# Patient Record
Sex: Female | Born: 1975 | Race: Black or African American | Hispanic: No | Marital: Single | Smoking: Never smoker
Health system: Southern US, Community
[De-identification: ages and names within clinical notes are randomized; demographics above are authoritative.]

## PROBLEM LIST (undated history)

## (undated) DIAGNOSIS — J302 Other seasonal allergic rhinitis: Secondary | ICD-10-CM

## (undated) DIAGNOSIS — R51 Headache: Secondary | ICD-10-CM

## (undated) DIAGNOSIS — J45909 Unspecified asthma, uncomplicated: Secondary | ICD-10-CM

## (undated) HISTORY — DX: Unspecified asthma, uncomplicated: J45.909

## (undated) HISTORY — DX: Other seasonal allergic rhinitis: J30.2

---

## 2001-03-27 ENCOUNTER — Encounter: Payer: Self-pay | Admitting: Family Medicine

## 2001-03-27 ENCOUNTER — Ambulatory Visit (HOSPITAL_COMMUNITY): Admission: RE | Admit: 2001-03-27 | Discharge: 2001-03-27 | Payer: Self-pay | Admitting: Family Medicine

## 2004-11-13 ENCOUNTER — Ambulatory Visit (HOSPITAL_COMMUNITY): Admission: RE | Admit: 2004-11-13 | Discharge: 2004-11-13 | Payer: Self-pay | Admitting: Internal Medicine

## 2005-02-02 ENCOUNTER — Emergency Department (HOSPITAL_COMMUNITY): Admission: EM | Admit: 2005-02-02 | Discharge: 2005-02-02 | Payer: Self-pay | Admitting: Emergency Medicine

## 2005-06-05 ENCOUNTER — Ambulatory Visit (HOSPITAL_COMMUNITY): Admission: RE | Admit: 2005-06-05 | Discharge: 2005-06-05 | Payer: Self-pay | Admitting: Obstetrics & Gynecology

## 2005-06-20 ENCOUNTER — Ambulatory Visit (HOSPITAL_COMMUNITY): Admission: RE | Admit: 2005-06-20 | Discharge: 2005-06-20 | Payer: Self-pay | Admitting: Obstetrics & Gynecology

## 2005-06-25 ENCOUNTER — Inpatient Hospital Stay (HOSPITAL_COMMUNITY): Admission: AD | Admit: 2005-06-25 | Discharge: 2005-06-25 | Payer: Self-pay | Admitting: Obstetrics & Gynecology

## 2005-07-22 ENCOUNTER — Ambulatory Visit (HOSPITAL_COMMUNITY): Admission: RE | Admit: 2005-07-22 | Discharge: 2005-07-22 | Payer: Self-pay | Admitting: Obstetrics & Gynecology

## 2005-11-30 ENCOUNTER — Emergency Department (HOSPITAL_COMMUNITY): Admission: EM | Admit: 2005-11-30 | Discharge: 2005-12-01 | Payer: Self-pay | Admitting: Emergency Medicine

## 2006-01-09 ENCOUNTER — Encounter: Admission: RE | Admit: 2006-01-09 | Discharge: 2006-01-09 | Payer: Self-pay | Admitting: Ophthalmology

## 2006-06-02 ENCOUNTER — Emergency Department (HOSPITAL_COMMUNITY): Admission: EM | Admit: 2006-06-02 | Discharge: 2006-06-03 | Payer: Self-pay | Admitting: Emergency Medicine

## 2007-03-20 ENCOUNTER — Encounter: Admission: RE | Admit: 2007-03-20 | Discharge: 2007-03-20 | Payer: Self-pay | Admitting: Internal Medicine

## 2007-03-27 ENCOUNTER — Encounter (INDEPENDENT_AMBULATORY_CARE_PROVIDER_SITE_OTHER): Payer: Self-pay | Admitting: *Deleted

## 2007-03-27 ENCOUNTER — Ambulatory Visit (HOSPITAL_COMMUNITY): Admission: RE | Admit: 2007-03-27 | Discharge: 2007-03-27 | Payer: Self-pay | Admitting: *Deleted

## 2007-12-22 ENCOUNTER — Emergency Department (HOSPITAL_COMMUNITY): Admission: EM | Admit: 2007-12-22 | Discharge: 2007-12-23 | Payer: Self-pay | Admitting: Emergency Medicine

## 2008-10-04 ENCOUNTER — Encounter: Admission: RE | Admit: 2008-10-04 | Discharge: 2008-10-04 | Payer: Self-pay | Admitting: Internal Medicine

## 2009-07-01 HISTORY — PX: OTHER SURGICAL HISTORY: SHX169

## 2009-09-05 ENCOUNTER — Ambulatory Visit (HOSPITAL_COMMUNITY): Admission: RE | Admit: 2009-09-05 | Discharge: 2009-09-05 | Payer: Self-pay | Admitting: Obstetrics and Gynecology

## 2009-12-07 ENCOUNTER — Encounter: Admission: RE | Admit: 2009-12-07 | Discharge: 2009-12-07 | Payer: Self-pay | Admitting: Specialist

## 2010-07-22 ENCOUNTER — Encounter: Payer: Self-pay | Admitting: Internal Medicine

## 2010-07-22 ENCOUNTER — Encounter: Payer: Self-pay | Admitting: Obstetrics & Gynecology

## 2010-09-23 LAB — HCG, SERUM, QUALITATIVE: Preg, Serum: NEGATIVE

## 2010-09-23 LAB — CBC
MCHC: 34.4 g/dL (ref 30.0–36.0)
MCV: 97.5 fL (ref 78.0–100.0)
Platelets: 142 10*3/uL — ABNORMAL LOW (ref 150–400)
RBC: 4.02 MIL/uL (ref 3.87–5.11)

## 2010-11-13 NOTE — Op Note (Signed)
Amber Neal, POMMIER                     ACCOUNT NO.:  [id number]   MEDICAL RECORD NO.:  [id number]          PATIENT TYPE:  AMB   LOCATION:  SDC                           FACILITY:  [facility]   PHYSICIAN:  Gahanna B. Earlene Plater, M.D.  DATE OF BIRTH:  03/06/1976   DATE OF PROCEDURE:  03/27/2007  DATE OF DISCHARGE:                               OPERATIVE REPORT   PREOPERATIVE DIAGNOSES:  Abnormal uterine bleeding.   POSTOPERATIVE DIAGNOSES:  Abnormal uterine bleeding.   PROCEDURE:  Hysteroscopy D&C.   SURGEON:  Dr. Marina Gravel.   ASSISTANT:  None.   ANESTHESIA:  LMA general and 20 mL of 1% lidocaine paracervical block.   FINDINGS:  Proliferative endometrium, no definitive polyps.   SPECIMENS:  Endometrial curettings to pathology.   BLOOD LOSS:  Minimal.   COMPLICATIONS:  None.   FLUID DEFICIT:  Minimal.   INDICATIONS:  Patient with a history of irregular bleeding.  Ultrasound  showed a thickened endometrial stripe. Sonohysterogram showed a discrete  mass consistent with a polyp.  The patient advised of the risks of  surgery including infection, bleeding, uterine perforation, and damage  to surrounding organs.   PROCEDURE:  The patient was taken to the operating room and LMA  anesthesia obtained.  She was prepped and draped in standard fashion.  The bladder was emptied with in-and-out cath. Exam under anesthesia  showed a slightly enlarged anteverted uterus, no adnexal masses.   A paracervical block placed.  Cervix dilated to #21.  The diagnostic  hysteroscope was inserted after being flushed.  Good uterine distention  obtained.  Overall proliferative appearing endometrium without  definitive polyp seen.  The endometrium was sampled with the Randall  stone forceps and some polyp-like tissue was returned.  The endometrium  was then gently curetted. The scope was returned, no additional  abnormalities were seen and therefore the procedure was terminated.   Instruments were removed,  cervix hemostatic.  The patient tolerated the  procedure well with no complications.  She was taken to the recovery  room awake, alert in stable condition.      Gerri Spore B. Earlene Plater, M.D.  Electronically Signed     WBD/MEDQ  D:  03/27/2007  T:  03/27/2007  Job:  [id number]

## 2010-12-07 ENCOUNTER — Other Ambulatory Visit (HOSPITAL_COMMUNITY): Payer: Self-pay

## 2011-01-30 ENCOUNTER — Encounter (HOSPITAL_COMMUNITY): Payer: Self-pay | Admitting: *Deleted

## 2011-02-01 ENCOUNTER — Encounter (HOSPITAL_COMMUNITY): Payer: Self-pay | Admitting: Anesthesiology

## 2011-02-01 ENCOUNTER — Ambulatory Visit (HOSPITAL_COMMUNITY): Payer: BC Managed Care – PPO | Admitting: Anesthesiology

## 2011-02-01 ENCOUNTER — Other Ambulatory Visit: Payer: Self-pay | Admitting: Obstetrics and Gynecology

## 2011-02-01 ENCOUNTER — Encounter (HOSPITAL_COMMUNITY)
Admission: RE | Disposition: A | Discharge status: Home / Self Care | Payer: Self-pay | Source: Ambulatory Visit | Attending: Obstetrics and Gynecology

## 2011-02-01 ENCOUNTER — Ambulatory Visit (HOSPITAL_COMMUNITY): Payer: BC Managed Care – PPO

## 2011-02-01 ENCOUNTER — Ambulatory Visit (HOSPITAL_COMMUNITY)
Admission: RE | Admit: 2011-02-01 | Discharge: 2011-02-02 | Disposition: A | Discharge status: Home / Self Care | Payer: BC Managed Care – PPO | Source: Ambulatory Visit | Attending: Obstetrics and Gynecology | Admitting: Obstetrics and Gynecology

## 2011-02-01 DIAGNOSIS — N7013 Chronic salpingitis and oophoritis: Secondary | ICD-10-CM | POA: Insufficient documentation

## 2011-02-01 DIAGNOSIS — N971 Female infertility of tubal origin: Secondary | ICD-10-CM | POA: Insufficient documentation

## 2011-02-01 HISTORY — DX: Headache: R51

## 2011-02-01 HISTORY — PX: LAPAROSCOPY: SHX197

## 2011-02-01 LAB — CBC
HCT: 38.3 % (ref 36.0–46.0)
Hemoglobin: 13.9 g/dL (ref 12.0–15.0)
MCHC: 36.3 g/dL — ABNORMAL HIGH (ref 30.0–36.0)
Platelets: 165 10*3/uL (ref 150–400)
RBC: 4.12 MIL/uL (ref 3.87–5.11)
WBC: 4.6 10*3/uL (ref 4.0–10.5)

## 2011-02-01 SURGERY — LAPAROSCOPY OPERATIVE
Anesthesia: General | Site: Abdomen | Wound class: Clean Contaminated

## 2011-02-01 MED ORDER — SCOPOLAMINE 1 MG/3DAYS TD PT72: 1.0000 | MEDICATED_PATCH | Freq: Once | TRANSDERMAL | Status: DC

## 2011-02-01 MED ORDER — ROCURONIUM BROMIDE 100 MG/10ML IV SOLN
INTRAVENOUS | Status: DC | PRN
  Administered 2011-02-01: 25 mg via INTRAVENOUS

## 2011-02-01 MED ORDER — MIDAZOLAM HCL 2 MG/2ML IJ SOLN
INTRAMUSCULAR | Status: AC
  Filled 2011-02-01: qty 2

## 2011-02-01 MED ORDER — NEOSTIGMINE METHYLSULFATE 1 MG/ML IJ SOLN
INTRAMUSCULAR | Status: AC
  Filled 2011-02-01: qty 10

## 2011-02-01 MED ORDER — HYDROCODONE-ACETAMINOPHEN 5-325 MG PO TABS
1.0000 | ORAL_TABLET | Freq: Four times a day (QID) | ORAL | Status: DC | PRN
  Administered 2011-02-01 – 2011-02-02 (×2): 1 via ORAL
  Filled 2011-02-01: qty 1

## 2011-02-01 MED ORDER — MUPIROCIN 2 % EX OINT
TOPICAL_OINTMENT | CUTANEOUS | Status: AC
  Filled 2011-02-01: qty 22

## 2011-02-01 MED ORDER — CITRIC ACID-SODIUM CITRATE 334-500 MG/5ML PO SOLN: 30.0000 mL | Freq: Once | ORAL | Status: DC | PRN

## 2011-02-01 MED ORDER — BUPIVACAINE-EPINEPHRINE 0.25% -1:200000 IJ SOLN
INTRAMUSCULAR | Status: DC | PRN
  Administered 2011-02-01: 8 mL

## 2011-02-01 MED ORDER — FENTANYL CITRATE 0.05 MG/ML IJ SOLN
INTRAMUSCULAR | Status: DC | PRN
  Administered 2011-02-01 (×5): 50 ug via INTRAVENOUS

## 2011-02-01 MED ORDER — METHYLENE BLUE 1 % INJ SOLN
INTRAMUSCULAR | Status: DC | PRN
  Administered 2011-02-01: 1 mL via SUBMUCOSAL

## 2011-02-01 MED ORDER — FENTANYL CITRATE 0.05 MG/ML IJ SOLN
INTRAMUSCULAR | Status: AC
  Filled 2011-02-01: qty 2

## 2011-02-01 MED ORDER — FENTANYL CITRATE 0.05 MG/ML IJ SOLN
25.0000 ug | INTRAMUSCULAR | Status: DC | PRN
  Administered 2011-02-01: 50 ug via INTRAVENOUS
  Administered 2011-02-01: 25 ug via INTRAVENOUS

## 2011-02-01 MED ORDER — IOHEXOL 300 MG/ML  SOLN
INTRAMUSCULAR | Status: DC | PRN
  Administered 2011-02-01: 50 mL via INTRATHECAL

## 2011-02-01 MED ORDER — METOCLOPRAMIDE HCL 10 MG PO TABS: 10.0000 mg | ORAL_TABLET | Freq: Once | ORAL | Status: DC

## 2011-02-01 MED ORDER — LIDOCAINE HCL (CARDIAC) 20 MG/ML IV SOLN
INTRAVENOUS | Status: AC
  Filled 2011-02-01: qty 5

## 2011-02-01 MED ORDER — DOXYCYCLINE HYCLATE 100 MG IV SOLR
100.0000 mg | Freq: Once | INTRAVENOUS | Status: AC
  Administered 2011-02-01: 100 mg via INTRAVENOUS
  Filled 2011-02-01: qty 100

## 2011-02-01 MED ORDER — PROPOFOL 10 MG/ML IV EMUL
INTRAVENOUS | Status: DC | PRN
  Administered 2011-02-01: 150 mg via INTRAVENOUS

## 2011-02-01 MED ORDER — ROCURONIUM BROMIDE 50 MG/5ML IV SOLN
INTRAVENOUS | Status: AC
  Filled 2011-02-01: qty 1

## 2011-02-01 MED ORDER — DEXAMETHASONE SODIUM PHOSPHATE 10 MG/ML IJ SOLN
INTRAMUSCULAR | Status: AC
  Filled 2011-02-01: qty 1

## 2011-02-01 MED ORDER — ONDANSETRON HCL 4 MG/2ML IJ SOLN
INTRAMUSCULAR | Status: AC
  Filled 2011-02-01: qty 2

## 2011-02-01 MED ORDER — LACTATED RINGERS IR SOLN
Status: DC | PRN
  Administered 2011-02-01: 3000 mL

## 2011-02-01 MED ORDER — DEXAMETHASONE SODIUM PHOSPHATE 4 MG/ML IJ SOLN
INTRAMUSCULAR | Status: DC | PRN
  Administered 2011-02-01: 10 mg via INTRAVENOUS

## 2011-02-01 MED ORDER — KETOROLAC TROMETHAMINE 30 MG/ML IJ SOLN
INTRAMUSCULAR | Status: AC
  Filled 2011-02-01: qty 1

## 2011-02-01 MED ORDER — FENTANYL CITRATE 0.05 MG/ML IJ SOLN
INTRAMUSCULAR | Status: AC
  Filled 2011-02-01: qty 5

## 2011-02-01 MED ORDER — PANTOPRAZOLE SODIUM 40 MG PO TBEC: 40.0000 mg | DELAYED_RELEASE_TABLET | Freq: Once | ORAL | Status: DC | PRN

## 2011-02-01 MED ORDER — PROPOFOL 10 MG/ML IV EMUL
INTRAVENOUS | Status: AC
  Filled 2011-02-01: qty 20

## 2011-02-01 MED ORDER — KETOROLAC TROMETHAMINE 30 MG/ML IJ SOLN
INTRAMUSCULAR | Status: DC | PRN
  Administered 2011-02-01: 30 mg via INTRAVENOUS

## 2011-02-01 MED ORDER — PROMETHAZINE HCL 25 MG/ML IJ SOLN: 6.2500 mg | INTRAMUSCULAR | Status: DC | PRN

## 2011-02-01 MED ORDER — GLYCOPYRROLATE 0.2 MG/ML IJ SOLN
INTRAMUSCULAR | Status: DC | PRN
  Administered 2011-02-01: .6 mg via INTRAVENOUS

## 2011-02-01 MED ORDER — LACTATED RINGERS IV SOLN
INTRAVENOUS | Status: DC
  Administered 2011-02-01 (×4): via INTRAVENOUS

## 2011-02-01 MED ORDER — CEFAZOLIN SODIUM 1-5 GM-% IV SOLN
INTRAVENOUS | Status: AC
  Administered 2011-02-01: 1 g via INTRAVENOUS
  Filled 2011-02-01: qty 50

## 2011-02-01 MED ORDER — GLYCOPYRROLATE 0.2 MG/ML IJ SOLN
INTRAMUSCULAR | Status: AC
  Filled 2011-02-01: qty 3

## 2011-02-01 MED ORDER — KETOROLAC TROMETHAMINE 30 MG/ML IJ SOLN: 15.0000 mg | Freq: Once | INTRAMUSCULAR | Status: DC | PRN

## 2011-02-01 MED ORDER — ACETAMINOPHEN 325 MG PO TABS: 325.0000 mg | ORAL_TABLET | ORAL | Status: DC | PRN

## 2011-02-01 MED ORDER — MIDAZOLAM HCL 5 MG/5ML IJ SOLN
INTRAMUSCULAR | Status: DC | PRN
  Administered 2011-02-01: 2 mg via INTRAVENOUS

## 2011-02-01 MED ORDER — FAMOTIDINE 20 MG PO TABS: 20.0000 mg | ORAL_TABLET | Freq: Once | ORAL | Status: DC | PRN

## 2011-02-01 MED ORDER — ONDANSETRON HCL 4 MG/2ML IJ SOLN
INTRAMUSCULAR | Status: DC | PRN
  Administered 2011-02-01: 4 mg via INTRAVENOUS

## 2011-02-01 MED ORDER — NEOSTIGMINE METHYLSULFATE 1 MG/ML IJ SOLN
INTRAMUSCULAR | Status: DC | PRN
  Administered 2011-02-01: 4 mg via INTRAMUSCULAR

## 2011-02-01 MED ORDER — LIDOCAINE HCL (CARDIAC) 20 MG/ML IV SOLN
INTRAVENOUS | Status: DC | PRN
  Administered 2011-02-01: 25 mg via INTRAVENOUS

## 2011-02-01 SURGICAL SUPPLY — 44 items
ADAPTER CATH SYR TO TUBING 38M (ADAPTER) IMPLANT
ADH SKN CLS APL DERMABOND .7 (GAUZE/BANDAGES/DRESSINGS) ×1
ADPR CATH LL SYR 3/32 TPR (ADAPTER)
BAG SPEC RTRVL LRG 6X4 10 (ENDOMECHANICALS)
BARRIER ADHS 3X4 INTERCEED (GAUZE/BANDAGES/DRESSINGS) ×1 IMPLANT
BRR ADH 4X3 ABS CNTRL BYND (GAUZE/BANDAGES/DRESSINGS) ×1
BRR ADH 6X5 SEPRAFILM 1 SHT (MISCELLANEOUS)
CABLE HIGH FREQUENCY MONO STRZ (ELECTRODE) IMPLANT
CATH ROBINSON RED A/P 16FR (CATHETERS) IMPLANT
CLOTH BEACON ORANGE TIMEOUT ST (SAFETY) ×2 IMPLANT
DERMABOND ADVANCED (GAUZE/BANDAGES/DRESSINGS) ×1
DERMABOND ADVANCED .7 DNX12 (GAUZE/BANDAGES/DRESSINGS) IMPLANT
DEVICE TROCAR PUNCTURE CLOSURE (ENDOMECHANICALS) IMPLANT
DRAPE C-ARM 42X72 X-RAY (DRAPES) ×1 IMPLANT
DRAPE UTILITY XL STRL (DRAPES) ×2 IMPLANT
ELECT NDL TIP 2.8 STRL (NEEDLE) IMPLANT
ELECT NEEDLE TIP 2.8 STRL (NEEDLE) IMPLANT
ELECT REM PT RETURN 9FT ADLT (ELECTROSURGICAL) ×2
ELECTRODE REM PT RTRN 9FT ADLT (ELECTROSURGICAL) ×1 IMPLANT
EVACUATOR SMOKE 8.L (FILTER) IMPLANT
FORCEPS CUTTING 33CM 5MM (CUTTING FORCEPS) IMPLANT
GLOVE BIO SURGEON STRL SZ8 (GLOVE) ×4 IMPLANT
GLOVE BIOGEL PI IND STRL 8.5 (GLOVE) ×2 IMPLANT
GLOVE BIOGEL PI INDICATOR 8.5 (GLOVE) ×2
GOWN PREVENTION PLUS LG XLONG (DISPOSABLE) ×4 IMPLANT
MANIPULATOR UTERINE 4.5 ZUMI (MISCELLANEOUS) ×2 IMPLANT
PACK LAPAROSCOPY BASIN (CUSTOM PROCEDURE TRAY) ×2 IMPLANT
PENCIL BUTTON HOLSTER BLD 10FT (ELECTRODE) IMPLANT
POUCH SPECIMEN RETRIEVAL 10MM (ENDOMECHANICALS) IMPLANT
SCALPEL HARMONIC ACE (MISCELLANEOUS) ×1 IMPLANT
SEPRAFILM MEMBRANE 5X6 (MISCELLANEOUS) IMPLANT
SET IRRIG TUBING LAPAROSCOPIC (IRRIGATION / IRRIGATOR) IMPLANT
SLEEVE Z-THREAD 5X100MM (TROCAR) ×6 IMPLANT
SUT PROLENE 0 CT 1 30 (SUTURE) IMPLANT
SUT VIC AB 2-0 UR6 27 (SUTURE) IMPLANT
SYR 20CC LL (SYRINGE) IMPLANT
SYR 50ML LL SCALE MARK (SYRINGE) IMPLANT
SYR TOOMEY 50ML (SYRINGE) IMPLANT
TOWEL OR 17X24 6PK STRL BLUE (TOWEL DISPOSABLE) ×5 IMPLANT
TRAY FOLEY CATH 14FR (SET/KITS/TRAYS/PACK) ×1 IMPLANT
TROCAR Z-THREAD BLADED 11X100M (TROCAR) IMPLANT
TROCAR Z-THREAD BLADED 5X100MM (TROCAR) ×2 IMPLANT
WARMER LAPAROSCOPE (MISCELLANEOUS) ×2 IMPLANT
WATER STERILE IRR 1000ML POUR (IV SOLUTION) ×1 IMPLANT

## 2011-02-01 NOTE — Brief Op Note (Addendum)
02/01/2011  3:10 PM  PATIENT:  Amber Neal  35 y.o. female  PRE-OPERATIVE DIAGNOSIS:  Pelvic Adhesions; Hydrosalpinx  POST-OPERATIVE DIAGNOSIS:  Uterine Adhesions; Left Hydrosalpinx; right proximal Tubal Stenosis  PROCEDURE:  Procedure(s): LAPAROSCOPY OPERATIVE, ADHESIOLYSIS, PARTIAL LEFT SALPINGECTOMY, RIGHT SALPINGOOOPHOROLYSIS, INTRAOP HSG, FLUOROSCOPIC RIGHT TUBAL CATHETETERIZATION  SURGEON:  Surgeon(s): Jemia Fata  PHYSICIAN ASSISTANT:   ASSISTANTS: none   ANESTHESIA:   general  ESTIMATED BLOOD LOSS: * No blood loss amount entered *   BLOOD ADMINISTERED:none  DRAINS: none   LOCAL MEDICATIONS USED:  MARCAINE   SPECIMEN:  Biopsy / Limited Resection left hydrosalpinx, pelvic adhesions  DISPOSITION OF SPECIMEN:  PATHOLOGY  COUNTS:  YES  TOURNIQUET:  * No tourniquets in log *  DICTATION #:   PLAN OF CARE: home today  PATIENT DISPOSITION:  PACU - hemodynamically stable.   Delay start of Pharmacological VTE agent (>24hrs) due to surgical blood loss or risk of bleeding:  not applicable

## 2011-02-01 NOTE — Anesthesia Preprocedure Evaluation (Signed)
Anesthesia Evaluation  Name, MR# and DOB Patient awake  General Assessment Comment  Reviewed: Allergy & Precautions, H&P , Patient's Chart, lab work & pertinent test results and reviewed documented beta blocker date and time   History of Anesthesia Complications Negative for: history of anesthetic complications  Airway Mallampati: II TM Distance: >3 FB Neck ROM: full    Dental No notable dental hx    Pulmonaryneg pulmonary ROS    clear to auscultation  pulmonary exam normal   Cardiovascular Exercise Tolerance: Good regular Normal   Neuro/PsychNegative Neurological ROS Negative Psych ROS  GI/Hepatic/Renal negative GI ROS, negative Liver ROS, and negative Renal ROS (+)       Endo/Other  Negative Endocrine ROS (+)   Abdominal   Musculoskeletal  Hematology negative hematology ROS (+)   Peds  Reproductive/Obstetrics negative OB ROS   Anesthesia Other Findings             Anesthesia Physical Anesthesia Plan  ASA: I  Anesthesia Plan: General   Post-op Pain Management:    Induction:   Airway Management Planned:   Additional Equipment:   Intra-op Plan:   Post-operative Plan:   Informed Consent: I have reviewed the patients History and Physical, chart, labs and discussed the procedure including the risks, benefits and alternatives for the proposed anesthesia with the patient or authorized representative who has indicated his/her understanding and acceptance.   Dental Advisory Given  Plan Discussed with: CRNA and Surgeon  Anesthesia Plan Comments:         Anesthesia Quick Evaluation

## 2011-02-01 NOTE — Progress Notes (Signed)
Pt still very sleepy, md made aware, orders received, will admit for 23 hour observation and discharge when pt is alert and taking pos without difficulty, transferred to room via wheelchair, states pain is an 8/10 will not give pain meds at present due to sedation

## 2011-02-01 NOTE — Transfer of Care (Signed)
Immediate Anesthesia Transfer of Care Note  Patient: Amber Neal  Procedure(s) Performed:  LAPAROSCOPY OPERATIVE - Lysis of Adhesions; Left Partial Salpingectomy; Removal of Left Hydrosalpinx; Chromopertubation; Flouroscopy with Cannullization of Right Fallopian Tube  Patient Location: PACU  Anesthesia Type: General  Level of Consciousness: awake, alert  and oriented  Airway & Oxygen Therapy: Patient Spontanous Breathing and Patient connected to nasal cannula oxygen  Post-op Assessment: Report given to PACU RN and Post -op Vital signs reviewed and stable  Post vital signs: Reviewed and stable  Complications: No apparent anesthesia complications

## 2011-02-01 NOTE — Progress Notes (Signed)
Pt up to bathroom to void unable on first attempt, very unstable upon ambulation, states she is sleepy, placed in recliner, coke and crackers given, friends at side and discharge instructions reviewed, second attempt to void with "some" results per pt, small amount of bloody drainage noted on peripad, pt states she wants to go back to sleep, sitting up in recliner will continue to monitor

## 2011-02-01 NOTE — Anesthesia Postprocedure Evaluation (Signed)
  Anesthesia Post-op Note  Patient: Amber Neal  Procedure(s) Performed:  LAPAROSCOPY OPERATIVE - Lysis of Adhesions; Left Partial Salpingectomy; Removal of Left Hydrosalpinx; Chromopertubation; Flouroscopy with Cannullization of Right Fallopian Tube  Anesthesia Post Note  Patient: Amber Neal  Procedure(s) Performed:  LAPAROSCOPY OPERATIVE - Lysis of Adhesions; Left Partial Salpingectomy; Removal of Left Hydrosalpinx; Chromopertubation; Flouroscopy with Cannullization of Right Fallopian Tube  Anesthesia type: General  Patient location: PACU  Post pain: Pain level controlled  Post assessment: Post-op Vital signs reviewed, Patient's Cardiovascular Status Stable, Respiratory Function Stable, Patent Airway and Pain level controlled  Last Vitals:  Filed Vitals:   02/01/11 1600  BP: 114/80  Pulse: 72  Temp:   Resp: 18    Post vital signs: Reviewed and stable  Level of consciousness: awake, alert  and oriented  Complications: No apparent anesthesia complications

## 2011-02-01 NOTE — Anesthesia Procedure Notes (Addendum)
Procedure Name: Intubation Date/Time: 02/01/2011 12:32 PM Performed by: Lincoln Brigham Pre-anesthesia Checklist: Patient identified, Emergency Drugs available, Suction available, Patient being monitored and Timeout performed Patient Re-evaluated:Patient Re-evaluated prior to inductionOxygen Delivery Method: Circle System Utilized Preoxygenation: Pre-oxygenation with 100% oxygen Intubation Type: IV induction Ventilation: Mask ventilation without difficulty Laryngoscope Size: Miller and 2 Grade View: Grade I Tube type: Oral Tube size: 7.0 mm Number of attempts: 1 Placement Confirmation: ETT inserted through vocal cords under direct vision,  breath sounds checked- equal and bilateral and positive ETCO2 Secured at: 21 cm Tube secured with: Tape Dental Injury: Teeth and Oropharynx as per pre-operative assessment

## 2011-02-01 NOTE — OR Nursing (Signed)
interceed 3x4in applied during procedure by Dr. April Manson

## 2011-02-02 NOTE — Op Note (Signed)
Amber Neal, Neal                     ACCOUNT NO.:  [id number]  MEDICAL RECORD NO.:  [id number]  LOCATION:  9307                          FACILITY:  [facility]  PHYSICIAN:  Fermin Schwab, MD   DATE OF BIRTH:  [birth year]  DATE OF PROCEDURE:  02/01/2011 DATE OF DISCHARGE:                              OPERATIVE REPORT   PREOPERATIVE DIAGNOSES:  Bilateral hydrosalpinx, pelvic adhesions.  POSTOPERATIVE DIAGNOSES:  Pelvic adhesions, left hydrosalpinx.  The right proximal tubal stenosis.  SURGEON:  Fermin Schwab, MD  ANESTHESIA:  General endotracheal.  PROCEDURE:  Laparoscopy, lysis of adhesions, left partial salpingectomy, right salpingo-oophorolysis, intraoperative  hysterosalpingography and fluoroscopic tubal catheterization.  ESTIMATED BLOOD LOSS:  200 mL.  SPECIMENS:  Pelvic adhesions and portion of left hydrosalpinx to Pathology.  COMPLICATIONS:  None.  INTRAOPERATIVE FINDINGS:  The uterus sounded to 9 cm.  External genitalia, vulva, vagina, and ectocervix were within normal limits.  The uterus was retroverted and normal size and mobile.  On laparoscopy, diaphragm, liver, gallbladder were within normal limits. The appendix was not visualized.  The anterior cul-de-sac was normal. The uterus was grossly normal.  Both tubes were convoluted with paratubal adhesions adherent to the respective ovary around 80% of their length with filmy adhesions.  The left tube was more convoluted and was involving the distal ends appeared dilated and showed hydrosalpinx. This was the case even though there were identifiable fimbria, just a few tufts of them at the distal end.  The ovary was adhered with dense adhesions and 50% over the surface area to the pelvic side wall.  The right tube looked somewhat edematous but could not be described as showing hydrosalpinx.  The fimbria were rated as 3/5.  Chromotubation with methylene blue did not show tubal filling, however, during intraoperative  HSG, I was able to see faint proximal filling of the right fallopian tube.  After fluoroscopic tubal catheterization the right tube filled and spilled with contrast material.  However, subsequent repeat chromotubation still showed nonfilling of the right fallopian tube.  DESCRIPTION OF PROCEDURE:  The patient was placed in dorsal supine position and 1 gram of Kefzol was given, 100 mg of doxycycline were to be given later intraoperatively.  She was given general endotracheal anesthesia and placed in dorsal supine position.  She was prepped and draped in sterile manner.  A Foley catheter was inserted into vagina and cervix was cannulated with ZUMI catheter, and this was used as Secretary/administrator.  The surgeon was re-gloved and created an operative field for laparoscopy.  After preemptive anesthesia of all incisions with 0.25% Marcaine with 1:200,000 epinephrine, a 5-mm infraumbilical skin incision was made and the keloid of the previous incision was excised. A Veress needle was inserted and pneumoperitoneum was created with carbon dioxide after the correct location of the needle was verified.  A 5-mm trocar was inserted and video laparoscopy was started with 30- degree telescope.  Two other ancillary incisions were made in each lower quadrant and 5-mm trocar was inserted.  The above findings were noted.  The 35 watts of cutting current was used on a needle electrode for extensive adhesiolysis.  This was carried  out on the right side.  In the left side, oophorolysis was not carried out since this tube was to be removed.  This tube was proximally further occluded.  I felt that there may be partial patency to the left tube and did not want to leave it with a hydrosalpinx that would be attached to the uterine cavity.  Therefore the decision was made to disconnect the left tube from the uterus.  This was done by transecting the tube at the uterotubal junction with Harmonic Ace and a 3-cm  segment was then coagulated and cut and sent to Pathology.  Because of the apparent nonfilling of the right tube decision was made to do an intraoperative aid HSG and fluoroscopic tubal catheterization. Fluoroscopy unit was placed over the patient and a cervical access catheter was placed in place of ZUMI and its balloon was inflated and Novy catheter was inserted through the cervical access catheter and it was wedged into the right tubal ostium, selective salpingography show further filling of the faintly opacified right tube to dilate any proximal stenosis.  A cornual access catheter with a guidewire was passed through the [place] catheter and the tube was cannulated.  Another salpingography showed full filling and spillage from the right tube. The surgeon was re-gowned and resumed the laparoscopy.  At that point, repeat chromotubation with ZUMI did not show tubal filling but this was the presumed to be due to a technical problem with ZUMI chromotubation. The right salpingo-oophorolysis was completed.  Hemostasis was checked. Pelvis was vigorously irrigated and aspirated.  Intercede was inserted into the abdomen and the right tube and ovary were wrapped in it and all the instruments were removed.  The incision were approximated with Dermabond.  Estimated blood loss was 100 mL.  The patient tolerated the procedure well and was transferred to recovery room in satisfactory condition.     Fermin Schwab, MD     TY/MEDQ  D:  02/01/2011  T:  02/02/2011  Job:  [id number]  cc:   Lenoard Aden, M.D. Fax: [phone]

## 2011-03-27 ENCOUNTER — Encounter (HOSPITAL_COMMUNITY): Payer: Self-pay | Admitting: Obstetrics and Gynecology

## 2011-04-11 LAB — DIFFERENTIAL
Basophils Absolute: 0
Lymphocytes Relative: 53 — ABNORMAL HIGH
Monocytes Absolute: 0.4
Neutro Abs: 1.5 — ABNORMAL LOW
Neutrophils Relative %: 34 — ABNORMAL LOW

## 2011-04-11 LAB — CBC
Hemoglobin: 13.5
RDW: 12

## 2011-04-11 LAB — PREGNANCY, URINE: Preg Test, Ur: NEGATIVE

## 2012-08-19 ENCOUNTER — Encounter (HOSPITAL_COMMUNITY): Payer: Self-pay | Admitting: *Deleted

## 2012-08-19 ENCOUNTER — Emergency Department (HOSPITAL_COMMUNITY)
Admission: EM | Admit: 2012-08-19 | Discharge: 2012-08-20 | Disposition: A | Discharge status: Home / Self Care | Payer: Self-pay | Attending: Emergency Medicine | Admitting: Emergency Medicine

## 2012-08-19 DIAGNOSIS — Z3202 Encounter for pregnancy test, result negative: Secondary | ICD-10-CM | POA: Insufficient documentation

## 2012-08-19 DIAGNOSIS — Z8679 Personal history of other diseases of the circulatory system: Secondary | ICD-10-CM | POA: Insufficient documentation

## 2012-08-19 DIAGNOSIS — F4321 Adjustment disorder with depressed mood: Secondary | ICD-10-CM | POA: Insufficient documentation

## 2012-08-19 LAB — COMPREHENSIVE METABOLIC PANEL
ALT: 12 U/L (ref 0–35)
AST: 19 U/L (ref 0–37)
Albumin: 3.8 g/dL (ref 3.5–5.2)
Alkaline Phosphatase: 39 U/L (ref 39–117)
CO2: 24 mEq/L (ref 19–32)
Chloride: 100 mEq/L (ref 96–112)
GFR calc non Af Amer: >90 mL/min (ref >90)
Potassium: 3.4 mEq/L — ABNORMAL LOW (ref 3.5–5.1)
Sodium: 136 mEq/L (ref 135–145)
Total Bilirubin: 0.3 mg/dL (ref 0.3–1.2)

## 2012-08-19 LAB — CBC
MCV: 90.8 fL (ref 78.0–100.0)
Platelets: 165 10*3/uL (ref 150–400)
RBC: 4.02 MIL/uL (ref 3.87–5.11)
WBC: 4 10*3/uL (ref 4.0–10.5)

## 2012-08-19 LAB — POCT PREGNANCY, URINE: Preg Test, Ur: NEGATIVE

## 2012-08-19 LAB — ACETAMINOPHEN LEVEL: Acetaminophen (Tylenol), Serum: <15 ug/mL (ref 10–30)

## 2012-08-19 NOTE — ED Notes (Signed)
WGN:[id number]<HY> Expected date:<BR> Expected time:<BR> Means of arrival:<BR> Comments:<BR> EMS/suicidal-HR 140

## 2012-08-19 NOTE — ED Notes (Signed)
Pt is very tearful on assessment, states she "does not know what is going on with her." Reports she left church today "not feeling right" when pt got home she got undressed, was reading the bible, "having bad thoughts" went to her neighbors house naked and requesting help. Pt admits to recent death of her brother. Sister at bedside at present. Pt calm and cooperative at present.

## 2012-08-19 NOTE — ED Notes (Signed)
Per EMS - pt was at church today, began "feeling weird" after church having palpitations. Pt then began to worry she was having a heart attack and subsequently started having suicidal ideations d/t the loss of her brother x2 weeks ago.

## 2012-08-20 LAB — RAPID URINE DRUG SCREEN, HOSP PERFORMED
Amphetamines: NOT DETECTED
Benzodiazepines: NOT DETECTED
Tetrahydrocannabinol: NOT DETECTED

## 2012-08-20 NOTE — ED Notes (Signed)
Pt ambulating independently w/ steady gait on d/c in no acute distress, A&Ox4.D/c instructions reviewed w/ pt and family - pt and family deny any further questions or concerns at present.  

## 2012-08-20 NOTE — ED Provider Notes (Signed)
History     CSN: [id number]  Arrival date & time 08/19/12  2152   First MD Initiated Contact with Patient 08/19/12 2316      Chief Complaint  Patient presents with  . Medical Clearance    (Consider location/radiation/quality/duration/timing/severity/associated sxs/prior treatment) HPI Hx per PT.   Not feeling well today, admits to sadness and depression - her brother recently died and tonight crying after going to church and about a 15 minute episode of palpitations, shaking and cying uncontrollably and called 911.  Now in the ED feels back to herself. No SI/ HI, no alcohol or drugs, no h/o same. PT worried that she may have had a panic attack with no h/o same. No medications and no Psych history.  No guns in the house. No AMS.   Past Medical History  Diagnosis Date  . Headache     migraines    Past Surgical History  Procedure Laterality Date  . Operative lap  2011  . Laparoscopy  02/01/2011    Procedure: LAPAROSCOPY OPERATIVE;  Surgeon: Fermin Schwab;  Location: [facility];  Service: Gynecology;  Laterality: N/A;  Lysis of Adhesions; Left Partial Salpingectomy; Removal of Left Hydrosalpinx; Chromopertubation; Flouroscopy with Cannullization of Right Fallopian Tube    History reviewed. No pertinent family history.  History  Substance Use Topics  . Smoking status: Never Smoker   . Smokeless tobacco: Not on file  . Alcohol Use: Yes     Comment: less than once a week    OB History   Grav Para Term Preterm Abortions TAB SAB Ect Mult Living                  Review of Systems  Constitutional: Negative for fever and chills.  HENT: Negative for neck pain and neck stiffness.   Eyes: Negative for pain.  Respiratory: Negative for shortness of breath.   Cardiovascular: Negative for chest pain.  Gastrointestinal: Negative for abdominal pain.  Genitourinary: Negative for dysuria.  Musculoskeletal: Negative for back pain.  Skin: Negative for rash.  Neurological: Negative for  headaches.  Psychiatric/Behavioral: Negative for self-injury.  All other systems reviewed and are negative.    Allergies  Latex  Home Medications  No current outpatient prescriptions on file.  BP 152/83  Pulse 99  Temp(Src) 98.4 F (36.9 C) (Oral)  Resp 16  SpO2 100%  Physical Exam  Constitutional: She is oriented to person, place, and time. She appears well-developed and well-nourished.  HENT:  Head: Normocephalic and atraumatic.  Eyes: Conjunctivae and EOM are normal. Pupils are equal, round, and reactive to light.  Neck: Normal range of motion. Neck supple.  Cardiovascular: Normal rate, regular rhythm and intact distal pulses.   Pulmonary/Chest: Effort normal. No respiratory distress. She exhibits no tenderness.  Abdominal: Soft. Bowel sounds are normal. She exhibits no distension.  Musculoskeletal: Normal range of motion. She exhibits no edema.  Neurological: She is alert and oriented to person, place, and time.  Skin: Skin is warm and dry.  Psychiatric: Her behavior is normal. Judgment and thought content normal.    ED Course  Procedures (including critical care time)  Results for orders placed during the hospital encounter of 08/19/12  ACETAMINOPHEN LEVEL      Result Value Range   Acetaminophen (Tylenol), Serum <15.0  10 - 30 ug/mL  CBC      Result Value Range   WBC 4.0  4.0 - 10.5 K/uL   RBC 4.02  3.87 - 5.11 MIL/uL  Hemoglobin 13.1  12.0 - 15.0 g/dL   HCT 78.4  69.6 - 29.5 %   MCV 90.8  78.0 - 100.0 fL   MCH 32.6  26.0 - 34.0 pg   MCHC 35.9  30.0 - 36.0 g/dL   RDW 28.4  13.2 - 44.0 %   Platelets 165  150 - 400 K/uL  COMPREHENSIVE METABOLIC PANEL      Result Value Range   Sodium 136  135 - 145 mEq/L   Potassium 3.4 (*) 3.5 - 5.1 mEq/L   Chloride 100  96 - 112 mEq/L   CO2 24  19 - 32 mEq/L   Glucose, Bld 104 (*) 70 - 99 mg/dL   BUN 8  6 - 23 mg/dL   Creatinine, Ser 1.02  0.50 - 1.10 mg/dL   Calcium 9.0  8.4 - 72.5 mg/dL   Total Protein 7.8  6.0 -  8.3 g/dL   Albumin 3.8  3.5 - 5.2 g/dL   AST 19  0 - 37 U/L   ALT 12  0 - 35 U/L   Alkaline Phosphatase 39  39 - 117 U/L   Total Bilirubin 0.3  0.3 - 1.2 mg/dL   GFR calc non Af Amer >90  >90 mL/min   GFR calc Af Amer >90  >90 mL/min  ETHANOL      Result Value Range   Alcohol, Ethyl (B) <11  0 - 11 mg/dL  SALICYLATE LEVEL      Result Value Range   Salicylate Lvl <2.0 (*) 2.8 - 20.0 mg/dL  URINE RAPID DRUG SCREEN (HOSP PERFORMED)      Result Value Range   Opiates NONE DETECTED  NONE DETECTED   Cocaine NONE DETECTED  NONE DETECTED   Benzodiazepines NONE DETECTED  NONE DETECTED   Amphetamines NONE DETECTED  NONE DETECTED   Tetrahydrocannabinol NONE DETECTED  NONE DETECTED   Barbiturates NONE DETECTED  NONE DETECTED  POCT PREGNANCY, URINE      Result Value Range   Preg Test, Ur NEGATIVE  NEGATIVE   Serial assessments - nursing notes reviewed - PT assures me she is not suicidal.   12:16 AM ACT involved - outpatient resource and referrals provided. I offered telepsych consult and PT declines - she is not suicidal and has no indication for IVC or admit at this time with symptoms resolved. She has family bedside and is appropriate for outpatient follow up and management.    MDM  Symptoms of panic attack with grief reaction.  No SI/ HI  Labs/ Ua/ UDS reviewed   Serial evaluations. PT has good social support and no psych history.   Plan close outpatient follow up with resources/ referrals provided.         Sunnie Nielsen, MD 08/20/12 [phone]

## 2012-08-28 ENCOUNTER — Ambulatory Visit (INDEPENDENT_AMBULATORY_CARE_PROVIDER_SITE_OTHER): Payer: Self-pay | Admitting: Internal Medicine

## 2012-08-28 DIAGNOSIS — Z0000 Encounter for general adult medical examination without abnormal findings: Secondary | ICD-10-CM

## 2012-08-28 DIAGNOSIS — Z789 Other specified health status: Secondary | ICD-10-CM

## 2012-08-28 DIAGNOSIS — Z23 Encounter for immunization: Secondary | ICD-10-CM

## 2012-08-28 MED ORDER — CIPROFLOXACIN HCL 500 MG PO TABS: 500.0000 mg | ORAL_TABLET | Freq: Two times a day (BID) | ORAL | Status: DC

## 2012-08-28 MED ORDER — ATOVAQUONE-PROGUANIL HCL 250-100 MG PO TABS: 1.0000 | ORAL_TABLET | Freq: Every day | ORAL | Status: DC

## 2012-08-28 NOTE — Progress Notes (Signed)
[facility] TRAVEL CLINIC NOTE  RFV: going to [place] for 2 wks for her brother's funeral Subjective:    Patient ID: Amber Neal, female    DOB: 1976/06/04, 37 y.o.   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 She is leaving march 13th thru 31st. Flying to [place], maybe going to [employer] in [place]. Has not been to [place] in 14yrs  Unclear of the vaccines she has received in the past  Review of Systems     Objective:   Physical Exam        Assessment & Plan:  Pre travel vaccinations = will give yellow fever, hep a and typhoid injection  Traveler's diarrhea = will give rx for cipro, gave tips sheet  Malaria proph = will give rx for malarone, gave tips sheet

## 2013-01-06 ENCOUNTER — Encounter (HOSPITAL_COMMUNITY): Payer: Self-pay | Admitting: Emergency Medicine

## 2013-01-06 ENCOUNTER — Emergency Department (HOSPITAL_COMMUNITY)
Admission: EM | Admit: 2013-01-06 | Discharge: 2013-01-06 | Disposition: A | Discharge status: Home / Self Care | Payer: No Typology Code available for payment source | Attending: Emergency Medicine | Admitting: Emergency Medicine

## 2013-01-06 DIAGNOSIS — Z7982 Long term (current) use of aspirin: Secondary | ICD-10-CM | POA: Insufficient documentation

## 2013-01-06 DIAGNOSIS — S0990XA Unspecified injury of head, initial encounter: Secondary | ICD-10-CM | POA: Insufficient documentation

## 2013-01-06 DIAGNOSIS — Y92410 Unspecified street and highway as the place of occurrence of the external cause: Secondary | ICD-10-CM | POA: Insufficient documentation

## 2013-01-06 DIAGNOSIS — Y9389 Activity, other specified: Secondary | ICD-10-CM | POA: Insufficient documentation

## 2013-01-06 DIAGNOSIS — Z91040 Latex allergy status: Secondary | ICD-10-CM | POA: Insufficient documentation

## 2013-01-06 DIAGNOSIS — S0993XA Unspecified injury of face, initial encounter: Secondary | ICD-10-CM | POA: Insufficient documentation

## 2013-01-06 DIAGNOSIS — S199XXA Unspecified injury of neck, initial encounter: Secondary | ICD-10-CM | POA: Insufficient documentation

## 2013-01-06 MED ORDER — NAPROXEN 500 MG PO TABS: 500.0000 mg | ORAL_TABLET | Freq: Two times a day (BID) | ORAL | Status: DC

## 2013-01-06 MED ORDER — METHOCARBAMOL 500 MG PO TABS: 1000.0000 mg | ORAL_TABLET | Freq: Four times a day (QID) | ORAL | Status: DC
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
## 2013-01-07 NOTE — ED Provider Notes (Signed)
Medical screening examination/treatment/procedure(s) were performed by non-physician practitioner and as supervising physician I was immediately available for consultation/collaboration.  Sunnie Nielsen, MD 01/07/13 [phone]

## 2013-11-21 ENCOUNTER — Emergency Department (HOSPITAL_COMMUNITY)
Admission: EM | Admit: 2013-11-21 | Discharge: 2013-11-22 | Disposition: A | Discharge status: Home / Self Care | Payer: BC Managed Care – PPO | Attending: Emergency Medicine | Admitting: Emergency Medicine

## 2013-11-21 ENCOUNTER — Encounter (HOSPITAL_COMMUNITY): Payer: Self-pay | Admitting: Emergency Medicine

## 2013-11-21 DIAGNOSIS — E876 Hypokalemia: Secondary | ICD-10-CM | POA: Insufficient documentation

## 2013-11-21 DIAGNOSIS — Z3202 Encounter for pregnancy test, result negative: Secondary | ICD-10-CM | POA: Insufficient documentation

## 2013-11-21 DIAGNOSIS — R197 Diarrhea, unspecified: Secondary | ICD-10-CM

## 2013-11-21 DIAGNOSIS — R112 Nausea with vomiting, unspecified: Secondary | ICD-10-CM | POA: Insufficient documentation
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
## 2013-11-22 LAB — COMPREHENSIVE METABOLIC PANEL
ALBUMIN: 3.6 g/dL (ref 3.5–5.2)
ALK PHOS: 41 U/L (ref 39–117)
ALT: 21 U/L (ref 0–35)
AST: 28 U/L (ref 0–37)
BUN: 7 mg/dL (ref 6–23)
CALCIUM: 8.6 mg/dL (ref 8.4–10.5)
CO2: 25 mEq/L (ref 19–32)
Chloride: 99 mEq/L (ref 96–112)
Creatinine, Ser: 0.8 mg/dL (ref 0.50–1.10)
GFR calc non Af Amer: >90 mL/min (ref >90)
Glucose, Bld: 100 mg/dL — ABNORMAL HIGH (ref 70–99)
POTASSIUM: 3.1 meq/L — AB (ref 3.7–5.3)
SODIUM: 136 meq/L — AB (ref 137–147)
TOTAL PROTEIN: 7.3 g/dL (ref 6.0–8.3)
Total Bilirubin: 0.6 mg/dL (ref 0.3–1.2)

## 2013-11-22 LAB — CBC WITH DIFFERENTIAL/PLATELET
BASOS PCT: 0 % (ref 0–1)
Basophils Absolute: 0 10*3/uL (ref 0.0–0.1)
EOS PCT: 1 % (ref 0–5)
Eosinophils Absolute: 0 10*3/uL (ref 0.0–0.7)
HCT: 35.7 % — ABNORMAL LOW (ref 36.0–46.0)
Hemoglobin: 13.1 g/dL (ref 12.0–15.0)
LYMPHS ABS: 0.8 10*3/uL (ref 0.7–4.0)
Lymphocytes Relative: 20 % (ref 12–46)
MCH: 33.7 pg (ref 26.0–34.0)
MCHC: 36.7 g/dL — ABNORMAL HIGH (ref 30.0–36.0)
MCV: 91.8 fL (ref 78.0–100.0)
Monocytes Absolute: 0.3 10*3/uL (ref 0.1–1.0)
Monocytes Relative: 8 % (ref 3–12)
NEUTROS PCT: 70 % (ref 43–77)
Neutro Abs: 2.8 10*3/uL (ref 1.7–7.7)
PLATELETS: 152 10*3/uL (ref 150–400)
RBC: 3.89 MIL/uL (ref 3.87–5.11)
RDW: 11.9 % (ref 11.5–15.5)
WBC: 4 10*3/uL (ref 4.0–10.5)

## 2013-11-22 LAB — URINALYSIS, ROUTINE W REFLEX MICROSCOPIC
Bilirubin Urine: NEGATIVE
Glucose, UA: NEGATIVE mg/dL
Hgb urine dipstick: NEGATIVE
KETONES UR: NEGATIVE mg/dL
LEUKOCYTES UA: NEGATIVE
Nitrite: NEGATIVE
PH: 6 (ref 5.0–8.0)
Protein, ur: NEGATIVE mg/dL
SPECIFIC GRAVITY, URINE: 1.017 (ref 1.005–1.030)
UROBILINOGEN UA: 0.2 mg/dL (ref 0.0–1.0)

## 2013-11-22 LAB — POC URINE PREG, ED: Preg Test, Ur: NEGATIVE

## 2013-11-22 MED ORDER — POTASSIUM CHLORIDE CRYS ER 20 MEQ PO TBCR
40.0000 meq | EXTENDED_RELEASE_TABLET | Freq: Once | ORAL | Status: AC
  Administered 2013-11-22: 40 meq via ORAL
  Filled 2013-11-22: qty 2

## 2013-11-22 NOTE — ED Provider Notes (Signed)
CSN: [id number]     Arrival date & time 11/21/13  2337 History   First MD Initiated Contact with Patient 11/21/13 2340     Chief Complaint  Patient presents with  . Fever  . Emesis  . Diarrhea     (Consider location/radiation/quality/duration/timing/severity/associated sxs/prior Treatment) HPI  Amber Neal is a generally healthy 38 yo woman who returned from [place] on Friday, May 22. She departed [place] on Thursday, May 21 after having arrived there, via [place], on May 2nd. She visited her home country in [place] for the purpose of attending her father's funeral. He died in a [facility] hospital (name of hospital unknown to the patient) on April 7 at the age of 36 yo. He had been in previous good health as the result of "a blood stream infection", according to the patient.   She believes that he became ill about a week before his illness. She states that the healthcare system is currently in very poor shape and she says her father was not tested for the Ebola Virus. During the course of her visit, she attended her father's open casket funeral on May 9. She did not touch the body of the corpse.   The patient had an uneventful course during her visit. She left [place] on May 21 and flew via [place], [place] and [place]. She arrived in [place] on May 22. She notes she felt exhausted on the day of her return and attributes this to the physiologic stress of prolonged travel and associated poor sleep.   She awoke yesterday morning feeling poorly and had two episodes of non-bloody diarrhea. She took her temp at that time, 0800h, and it was 98.6. She did not have any other symptoms or further diarrhea and went to work, as scheduled, at 3pm. 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 She vomited non bloody emesis x 1 at home at 1700h. She has not had any symptoms since then. She checked her temp at 2140h and it was 99.6.  She rechecked it at 2240h and it was 100.3. She  spoke to the health department nurse who advised her to come to the ED.   The patient denies any associated myalgias, arthrlagias, urinary symptoms, headache, respiratory symptoms, chest pain. Her LMP was Oct 30, 2013.   The patient is asymptomatic at this time, although, she is noted to have a rectal temp of 100.5 taken here at 0015.   The patient notes that she took Cipro, qd, as GI prophylaxis during her trip. In addition, she took a prescription malarial prophylactic but, does not know the name. She does not take any medications on a regular basis and has no medical allergies.  While in [place] she stayed with family and notes that none of them were ill. She states she did not come into contact with anyone she knew to be ill. She travelled alone to and from [place].   History reviewed. No pertinent past medical history. History reviewed. No pertinent past surgical history. No family history on file. History  Substance Use Topics  . Smoking status: Never Smoker   . Smokeless tobacco: Not on file  . Alcohol Use: No   OB History   Grav Para Term Preterm Abortions TAB SAB Ect Mult Living                 Review of Systems   Ten point review of symptoms performed and is negative with the exception of symptoms  noted above.   Allergies  Review of patient's allergies indicates no known allergies.  Home Medications   Prior to Admission medications   Not on File   BP 118/84  Pulse 101  Temp(Src) 98.7 F (37.1 C) (Oral)  Resp 16  SpO2 100%  LMP 10/30/2013 (per nursing doc) Physical Exam Gen: well developed and well nourished appearing Head: NCAT Eyes: PERL, EOMI Nose: no epistaixis or rhinorrhea Mouth/throat: mucosa is moist and pink, posterior oropharynx appears normal Neck: supple, no stridor, no adenopathhy Lungs: CTA B, no wheezing, rhonchi or rales CV: RRR, (pulse 88 bpm) no murmur, extremities appear well perfused.  Abd: soft, notender, nondistended Back: no ttp, no  cva ttp, normal to inspection Skin: warm and dry, no rash Ext: normal to inspection, no dependent edema Neuro: CN ii-xii grossly intact, no focal deficits, normal speech Psyche; normal affect,  calm and cooperative.   ED Course  Procedures (including critical care time) Labs Review   MDM   0040: Case discussed with Dr. Jose Persiaarl Williams - [facility] call.  I reviewed the patient's travel history, all of the details of the HPI, vital signs and physical exam findings with Dr. Mayford KnifeWilliams. He believes that it is extremely unlikely that the patient is Ebola Virus positive. He states he has been in consultation with his colleagues for the past hour or so. He does not have any questiona I asked him to contact [facility] for further consultation. He states he will reach [facility] at [facility] and call back with 3 way conference.   0120: Conference call with Mr. Jose PersiaCarl Williams [facility]([place] Epidemiologist) and Dr. Sharion BalloonKonrad Hyashi, M.D. Of the [facility]. Dr. Brunilda PayorHyashi has been briefed on the patient's HPI and presentation. He agrees with the patient is at very low risk for ebola infection and that, per CDC guidelines, serologic testing for Ebola Virus infection is not indicated. He agrees with my plan for symptomatic management. [facility] will [facility] the patient as an outpatient.   Brandt LoosenJulie Manly, MD 11/22/13 [phone]

## 2013-11-22 NOTE — ED Notes (Signed)
Pt provided ginger ale for po challenge.

## 2013-11-23 ENCOUNTER — Encounter (HOSPITAL_COMMUNITY): Payer: Self-pay | Admitting: Emergency Medicine

## 2015-10-20 ENCOUNTER — Encounter (HOSPITAL_COMMUNITY): Payer: Self-pay | Admitting: Oncology

## 2015-10-20 ENCOUNTER — Emergency Department (HOSPITAL_COMMUNITY)
Admission: EM | Admit: 2015-10-20 | Discharge: 2015-10-20 | Disposition: A | Discharge status: Home / Self Care | Payer: No Typology Code available for payment source | Attending: Emergency Medicine | Admitting: Emergency Medicine

## 2015-10-20 DIAGNOSIS — Y990 Civilian activity done for income or pay: Secondary | ICD-10-CM | POA: Insufficient documentation

## 2015-10-20 DIAGNOSIS — Z23 Encounter for immunization: Secondary | ICD-10-CM | POA: Insufficient documentation

## 2015-10-20 DIAGNOSIS — Y92129 Unspecified place in nursing home as the place of occurrence of the external cause: Secondary | ICD-10-CM | POA: Insufficient documentation

## 2015-10-20 DIAGNOSIS — Z791 Long term (current) use of non-steroidal anti-inflammatories (NSAID): Secondary | ICD-10-CM | POA: Insufficient documentation

## 2015-10-20 DIAGNOSIS — S21051A Open bite of right breast, initial encounter: Secondary | ICD-10-CM | POA: Insufficient documentation

## 2015-10-20 DIAGNOSIS — G43909 Migraine, unspecified, not intractable, without status migrainosus: Secondary | ICD-10-CM | POA: Insufficient documentation

## 2015-10-20 DIAGNOSIS — Z7982 Long term (current) use of aspirin: Secondary | ICD-10-CM | POA: Insufficient documentation

## 2015-10-20 DIAGNOSIS — W503XXA Accidental bite by another person, initial encounter: Secondary | ICD-10-CM | POA: Insufficient documentation

## 2015-10-20 DIAGNOSIS — Y93F9 Activity, other caregiving: Secondary | ICD-10-CM | POA: Insufficient documentation

## 2015-10-20 DIAGNOSIS — Z91040 Latex allergy status: Secondary | ICD-10-CM | POA: Insufficient documentation

## 2015-10-20 MED ORDER — TETANUS-DIPHTH-ACELL PERTUSSIS 5-2.5-18.5 LF-MCG/0.5 IM SUSP
0.5000 mL | Freq: Once | INTRAMUSCULAR | Status: AC
  Administered 2015-10-20: 0.5 mL via INTRAMUSCULAR
  Filled 2015-10-20: qty 0.5

## 2015-10-20 MED ORDER — TRAMADOL HCL 50 MG PO TABS: 50.0000 mg | ORAL_TABLET | Freq: Four times a day (QID) | ORAL | Status: DC | PRN

## 2015-10-20 MED ORDER — AMOXICILLIN-POT CLAVULANATE 875-125 MG PO TABS: 1.0000 | ORAL_TABLET | Freq: Two times a day (BID) | ORAL | Status: DC

## 2015-10-20 NOTE — Discharge Instructions (Signed)
Human Bite Human bite wounds tend to become infected, even when they seem minor at first. Infection can develop quickly, sometimes in a matter of hours. Bite wounds of the hand have a higher chance of infection compared to bites in other places and can be serious because the tendons and joints are close to the skin. SYMPTOMS  Common symptoms of a human bite include:   Bruising.   Broken skin.  Bleeding.  Pain. DIAGNOSIS  This condition may be diagnosed based on a physical exam and medical history. Your health care provider will examine the wound and ask for details about how the bite happened. If you have details about the medical history of the person who bit you, it is important to tell your health care provider. This will help determine if there is any chance that a disease may have been spread. You may have tests, such as:   Blood tests. This may be done if there is a chance of infection from diseases such as hepatitis or HIV.  X-rays to check for damage to bones or joints.  Culture test. This uses a sample of fluid from the wound to check for infection. TREATMENT  Treatment varies based on the location and severity of the bite and your medical history. Treatment may include:   Wound care. This often includes cleaning the wound, flushing the wound with saline solution, and applying a bandage (dressing). Sometimes, the wound is left open to heal because of the high risk of infection. However, in some cases, the wound may be closed with stitches (sutures), staples, skin glue, or adhesive strips.  Antibiotic medicine.  A flexible cast (splint).  Tetanus shot. In some cases, bites that have become infected may require IV antibiotics and surgical treatment in the hospital.  HOME CARE INSTRUCTIONS Wound Care  Follow instructions from your health care provider about how to take care of your wound. Make sure you:  Wash your hands with soap and water before you change your dressing.  If soap and water are not available, use hand sanitizer.  Change your dressing as told by your health care provider.  Leave sutures, skin glue, or adhesive strips in place. These skin closures may need to be in place for 2 weeks or longer. If adhesive strip edges start to loosen and curl up, you may trim the loose edges. Do not remove adhesive strips completely unless your health care provider tells you to do that.  Check your wound every day for signs of infection. Watch for:  Increasing redness, swelling, or pain.  Fluid, blood, or pus. General Instructions  Take or apply over-the-counter and prescription medicines only as told by your health care provider.  If you were prescribed an antibiotic, take or apply it as told by your health care provider. Do not stop using the antibiotic even if your condition improves.  Keep the injured area raised (elevated) above the level of your heart while you are sitting or lying down, if this is possible.  If directed, apply ice to the injured area:  Put ice in a plastic bag.  Place a towel between your skin and the bag.  Leave the ice on for 20 minutes, 2-3 times per day.  Keep all follow-up visits as told by your health care provider. This is important. SEEK MEDICAL CARE IF:  You have chills.   You have pain when you move your injured area.  You have trouble moving your injured area.   You are not   improving, or you are getting worse.  SEEK IMMEDIATE MEDICAL CARE IF:  You have increasing fluid, blood, or pus coming from your wound.  You have increasing redness, swelling, or pain at the site of your wound.   You have a red streak extending away from your wound.  You have a fever.    This information is not intended to replace advice given to you by your health care provider. Make sure you discuss any questions you have with your health care provider.   Document Released: 07/25/2004 Document Revised: 03/08/2015 Document  Reviewed: 11/02/2014 Elsevier Interactive Patient Education 2016 [employer]vier Inc.  
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
## 2016-05-09 DIAGNOSIS — Z1159 Encounter for screening for other viral diseases: Secondary | ICD-10-CM | POA: Diagnosis not present

## 2016-05-09 DIAGNOSIS — Z113 Encounter for screening for infections with a predominantly sexual mode of transmission: Secondary | ICD-10-CM | POA: Diagnosis not present

## 2016-05-09 DIAGNOSIS — R87612 Low grade squamous intraepithelial lesion on cytologic smear of cervix (LGSIL): Secondary | ICD-10-CM | POA: Diagnosis not present

## 2016-05-09 DIAGNOSIS — Z01419 Encounter for gynecological examination (general) (routine) without abnormal findings: Secondary | ICD-10-CM | POA: Diagnosis not present

## 2016-05-09 DIAGNOSIS — R87810 Cervical high risk human papillomavirus (HPV) DNA test positive: Secondary | ICD-10-CM | POA: Diagnosis not present

## 2016-05-09 DIAGNOSIS — Z114 Encounter for screening for human immunodeficiency virus [HIV]: Secondary | ICD-10-CM | POA: Diagnosis not present

## 2016-05-09 DIAGNOSIS — Z6826 Body mass index (BMI) 26.0-26.9, adult: Secondary | ICD-10-CM | POA: Diagnosis not present

## 2016-06-06 DIAGNOSIS — R140 Abdominal distension (gaseous): Secondary | ICD-10-CM | POA: Diagnosis not present

## 2016-06-06 DIAGNOSIS — R109 Unspecified abdominal pain: Secondary | ICD-10-CM | POA: Diagnosis not present

## 2016-06-06 DIAGNOSIS — N926 Irregular menstruation, unspecified: Secondary | ICD-10-CM | POA: Diagnosis not present

## 2016-06-10 ENCOUNTER — Encounter (HOSPITAL_COMMUNITY): Payer: Self-pay | Admitting: Emergency Medicine

## 2016-06-10 ENCOUNTER — Emergency Department (HOSPITAL_COMMUNITY)
Admission: EM | Admit: 2016-06-10 | Discharge: 2016-06-10 | Disposition: A | Discharge status: Home / Self Care | Payer: BLUE CROSS/BLUE SHIELD | Attending: Emergency Medicine | Admitting: Emergency Medicine

## 2016-06-10 ENCOUNTER — Emergency Department (HOSPITAL_COMMUNITY): Payer: BLUE CROSS/BLUE SHIELD

## 2016-06-10 DIAGNOSIS — M25559 Pain in unspecified hip: Secondary | ICD-10-CM

## 2016-06-10 DIAGNOSIS — M545 Low back pain: Secondary | ICD-10-CM | POA: Diagnosis not present

## 2016-06-10 DIAGNOSIS — Z7982 Long term (current) use of aspirin: Secondary | ICD-10-CM | POA: Diagnosis not present

## 2016-06-10 DIAGNOSIS — M25551 Pain in right hip: Secondary | ICD-10-CM | POA: Insufficient documentation

## 2016-06-10 DIAGNOSIS — Z91040 Latex allergy status: Secondary | ICD-10-CM | POA: Diagnosis not present

## 2016-06-10 DIAGNOSIS — G5711 Meralgia paresthetica, right lower limb: Secondary | ICD-10-CM | POA: Diagnosis not present

## 2016-06-10 LAB — POC URINE PREG, ED: Preg Test, Ur: NEGATIVE

## 2016-06-10 MED ORDER — HYDROCODONE-ACETAMINOPHEN 5-325 MG PO TABS: 1.0000 | ORAL_TABLET | Freq: Four times a day (QID) | ORAL | 0 refills | Status: DC | PRN

## 2016-06-10 MED ORDER — NAPROXEN 500 MG PO TABS
500.0000 mg | ORAL_TABLET | Freq: Two times a day (BID) | ORAL | 0 refills | Status: DC
Start: 2016-06-10 — End: 2017-02-20

## 2016-06-10 NOTE — ED Provider Notes (Signed)
[facility]-EMERGENCY DEPT Provider Note   CSN: [id number] Arrival date & time: 06/10/16  1734   By signing my name below, I, Clovis PuAvnee Patel, attest that this documentation has been prepared under the direction and in the presence of  Arthor CaptainAbigail Michaele Amundson, PA-C. Electronically Signed: Clovis PuAvnee Patel, ED Scribe. 06/10/16. 6:56 PM.   History   Chief Complaint Chief Complaint  Patient presents with  . Hip Pain    The history is provided by the patient. No language interpreter was used.   HPI Comments:  Amber Neal is a 40 y.o. female who presents to the Emergency Department complaining of recurrent, sudden onset, moderate right hip pain which radiates down the front of her right leg and into her groin x several months. Her pain is worse with ambulation and movement of her leg. No alleviating factors noted. Pt denies any back injury, hip injury, numbness, tingling, a possibility of being pregnant, any major medical problems, any other associated symptoms and modifying factors at this time.     Past Medical History:  Diagnosis Date  . Headache(784.0)    migraines    There are no active problems to display for this patient.   Past Surgical History:  Procedure Laterality Date  . LAPAROSCOPY  02/01/2011   Procedure: LAPAROSCOPY OPERATIVE;  Surgeon: Fermin Schwabamer Yalcinkaya;  Location: [facility];  Service: Gynecology;  Laterality: N/A;  Lysis of Adhesions; Left Partial Salpingectomy; Removal of Left Hydrosalpinx; Chromopertubation; Flouroscopy with Cannullization of Right Fallopian Tube  . operative lap  2011    OB History    No data available       Home Medications    Prior to Admission medications   Medication Sig Start Date End Date Taking? Authorizing Provider  amoxicillin-clavulanate (AUGMENTIN) 875-125 MG tablet Take 1 tablet by mouth 2 (two) times daily. One po bid x 7 days 10/20/15   Arthor CaptainAbigail Michoel Kunin, PA-C  aspirin 325 MG buffered tablet Take 325 mg by mouth daily.    Historical Provider, MD  ibuprofen  (ADVIL,MOTRIN) 200 MG tablet Take 400 mg by mouth every 6 (six) hours as needed for pain (pain).    Historical Provider, MD  methocarbamol (ROBAXIN) 500 MG tablet Take 2 tablets (1,000 mg total) by mouth 4 (four) times daily. 01/06/13   Renne CriglerJoshua Geiple, PA-C  naproxen (NAPROSYN) 500 MG tablet Take 1 tablet (500 mg total) by mouth 2 (two) times daily. 01/06/13   Renne CriglerJoshua Geiple, PA-C  traMADol (ULTRAM) 50 MG tablet Take 1 tablet (50 mg total) by mouth every 6 (six) hours as needed. 10/20/15   Arthor CaptainAbigail Gumaro Brightbill, PA-C    Family History No family history on file.  Social History Social History  Substance Use Topics  . Smoking status: Never Smoker  . Smokeless tobacco: Never Used  . Alcohol use No     Comment: less than once a week     Allergies   Latex   Review of Systems Review of Systems  Musculoskeletal: Positive for arthralgias and myalgias. Negative for back pain.  Neurological: Negative for weakness and numbness.   Physical Exam Updated Vital Signs BP 113/83 (BP Location: Left Arm)   Pulse 81   Temp 98.7 F (37.1 C) (Oral)   Resp 16   Ht 5\' 3"  (1.6 m)   Wt 155 lb (70.3 kg)   SpO2 100%   BMI 27.46 kg/m   Physical Exam  Constitutional: She is oriented to person, place, and time. She appears well-developed and well-nourished. No distress.  HENT:  Head: Normocephalic  and atraumatic.  Eyes: Conjunctivae are normal.  Cardiovascular: Normal rate.   Pulmonary/Chest: Effort normal.  Abdominal: She exhibits no distension.  Musculoskeletal:  Pain with internal and external rotation of the hip. Stength normal. Pain with standing. No TTP. Normal ipsilateral knee and ankle exam. Patellar reflexes 2+ bilaterally.   Neurological: She is alert and oriented to person, place, and time.  Skin: Skin is warm and dry.  Psychiatric: She has a normal mood and affect.  Nursing note and vitals reviewed.   ED Treatments / Results  DIAGNOSTIC STUDIES:  Oxygen Saturation is 100% on RA, normal by  my interpretation.    COORDINATION OF CARE:  6:55 PM Discussed treatment plan with pt at bedside and pt agreed to plan.  Labs (all labs ordered are listed, but only abnormal results are displayed) Labs Reviewed - No data to display  EKG  EKG Interpretation None       Radiology No results found.  Procedures Procedures (including critical care time)  Medications Ordered in ED Medications - No data to display   Initial Impression / Assessment and Plan / ED Course  I have reviewed the triage vital signs and the nursing notes.  Pertinent labs & imaging results that were available during my care of the patient were reviewed by me and considered in my medical decision making (see chart for details).  Clinical Course    Work up pending. I have given sign out to PA Kirichenko   Final Clinical Impressions(s) / ED Diagnoses   Final diagnoses:  None    New Prescriptions New Prescriptions   No medications on file  I personally performed the services described in this documentation, which was scribed in my presence. The recorded information has been reviewed and is accurate.         Arthor Captainbigail Maximiliano Cromartie, PA-C 06/17/16 1751    Bethann BerkshireJoseph Zammit, MD 06/17/16 2116

## 2016-06-10 NOTE — Discharge Instructions (Signed)
Get help right away if: You have fallen. You have a sudden increase in pain and swelling in your hip

## 2016-06-10 NOTE — ED Triage Notes (Signed)
Pt reports right hip pain radiating to leg. denies Hx sciatica , no urinary symptoms.

## 2016-06-11 NOTE — ED Provider Notes (Signed)
Pt signed out to me at shift change. Pt with recurrent right high pain that she has had for years. Exam with no acute findings. xrays pending.   Pt's xrays are negative. Pt's paperwork and prescriptions were completed by PA harris. Home with close outpatient follow up. Plan and results discussed with her. Pt agrees. Requests work note for tomorrow.   Vitals:   06/10/16 1748  BP: 113/83  Pulse: 81  Resp: 16  Temp: 98.7 F (37.1 C)  TempSrc: Oral  SpO2: 100%  Weight: 70.3 kg  Height: 5\' 3"  (1.6 m)      Jaynie Crumbleatyana Jonael Paradiso, PA-C 06/11/16 0144    Pricilla LovelessScott Goldston, MD 06/11/16 1506

## 2016-06-12 DIAGNOSIS — R87612 Low grade squamous intraepithelial lesion on cytologic smear of cervix (LGSIL): Secondary | ICD-10-CM | POA: Diagnosis not present

## 2016-06-12 DIAGNOSIS — N870 Mild cervical dysplasia: Secondary | ICD-10-CM | POA: Diagnosis not present

## 2016-06-18 DIAGNOSIS — R300 Dysuria: Secondary | ICD-10-CM | POA: Diagnosis not present

## 2016-06-18 DIAGNOSIS — N760 Acute vaginitis: Secondary | ICD-10-CM | POA: Diagnosis not present

## 2016-06-18 DIAGNOSIS — B373 Candidiasis of vulva and vagina: Secondary | ICD-10-CM | POA: Diagnosis not present

## 2016-10-27 ENCOUNTER — Emergency Department (HOSPITAL_COMMUNITY)
Admission: EM | Admit: 2016-10-27 | Discharge: 2016-10-27 | Disposition: A | Discharge status: Home / Self Care | Payer: Self-pay | Attending: Emergency Medicine | Admitting: Emergency Medicine

## 2016-10-27 ENCOUNTER — Emergency Department (HOSPITAL_COMMUNITY): Payer: Self-pay

## 2016-10-27 ENCOUNTER — Encounter (HOSPITAL_COMMUNITY): Payer: Self-pay | Admitting: *Deleted

## 2016-10-27 DIAGNOSIS — R062 Wheezing: Secondary | ICD-10-CM | POA: Diagnosis not present

## 2016-10-27 DIAGNOSIS — R069 Unspecified abnormalities of breathing: Secondary | ICD-10-CM | POA: Diagnosis not present

## 2016-10-27 DIAGNOSIS — Z91040 Latex allergy status: Secondary | ICD-10-CM | POA: Insufficient documentation

## 2016-10-27 DIAGNOSIS — J9801 Acute bronchospasm: Secondary | ICD-10-CM | POA: Diagnosis not present

## 2016-10-27 DIAGNOSIS — R05 Cough: Secondary | ICD-10-CM | POA: Diagnosis not present

## 2016-10-27 DIAGNOSIS — Z79899 Other long term (current) drug therapy: Secondary | ICD-10-CM | POA: Insufficient documentation

## 2016-10-27 DIAGNOSIS — R0602 Shortness of breath: Secondary | ICD-10-CM | POA: Diagnosis not present

## 2016-10-27 LAB — BASIC METABOLIC PANEL
ANION GAP: 8 (ref 5–15)
BUN: 10 mg/dL (ref 6–20)
CO2: 25 mmol/L (ref 22–32)
Calcium: 9.3 mg/dL (ref 8.9–10.3)
Chloride: 106 mmol/L (ref 101–111)
Creatinine, Ser: 0.76 mg/dL (ref 0.44–1.00)
GFR calc non Af Amer: >60 mL/min (ref >60)
GLUCOSE: 103 mg/dL — AB (ref 65–99)
POTASSIUM: 2.8 mmol/L — AB (ref 3.5–5.1)
Sodium: 139 mmol/L (ref 135–145)

## 2016-10-27 LAB — CBC
HCT: 37.3 % (ref 36.0–46.0)
Hemoglobin: 13.3 g/dL (ref 12.0–15.0)
MCH: 32.9 pg (ref 26.0–34.0)
MCHC: 35.7 g/dL (ref 30.0–36.0)
MCV: 92.3 fL (ref 78.0–100.0)
Platelets: 186 10*3/uL (ref 150–400)
RBC: 4.04 MIL/uL (ref 3.87–5.11)
RDW: 12 % (ref 11.5–15.5)
WBC: 5.6 10*3/uL (ref 4.0–10.5)

## 2016-10-27 LAB — D-DIMER, QUANTITATIVE: D-Dimer, Quant: 0.59 ug/mL-FEU — ABNORMAL HIGH (ref 0.00–0.50)

## 2016-10-27 MED ORDER — LORATADINE 10 MG PO TABS: 10.0000 mg | ORAL_TABLET | Freq: Every day | ORAL | 1 refills | Status: DC

## 2016-10-27 MED ORDER — ALBUTEROL SULFATE HFA 108 (90 BASE) MCG/ACT IN AERS: 1.0000 | INHALATION_SPRAY | Freq: Four times a day (QID) | RESPIRATORY_TRACT | 0 refills | Status: DC | PRN

## 2016-10-27 MED ORDER — PREDNISONE 50 MG PO TABS: 50.0000 mg | ORAL_TABLET | Freq: Every day | ORAL | 0 refills | Status: DC

## 2016-10-27 MED ORDER — LORATADINE 10 MG PO TABS
10.0000 mg | ORAL_TABLET | Freq: Every day | ORAL | Status: DC
  Administered 2016-10-27: 10 mg via ORAL
  Filled 2016-10-27: qty 1

## 2016-10-27 MED ORDER — IOPAMIDOL (ISOVUE-370) INJECTION 76%
100.0000 mL | Freq: Once | INTRAVENOUS | Status: AC | PRN
  Administered 2016-10-27: 100 mL via INTRAVENOUS

## 2016-10-27 MED ORDER — IOPAMIDOL (ISOVUE-370) INJECTION 76%
INTRAVENOUS | Status: AC
  Filled 2016-10-27: qty 100

## 2016-10-27 NOTE — ED Notes (Signed)
Bed: ZO10 Expected date:  Expected time:  Means of arrival:  Comments: 41 yo SOB, neb given en route

## 2016-10-27 NOTE — ED Triage Notes (Signed)
BIB EMS with wheezing, abluterol tx and Solumedrol  given en route. IV #18 L AC After treatment wheezing decreased. Symptoms noted for over a week

## 2016-10-27 NOTE — Discharge Instructions (Signed)
Take the medications as prescribed, consider continuing the claritin on a daily basis during the spring , use the inhaler as needed for coughing , wheezing; follow up with a primary care doctor in the next week or two to be rechecked

## 2016-10-27 NOTE — ED Provider Notes (Signed)
[facility] DEPT Provider Note   CSN: [id number] Arrival date & time: 10/27/16  0911     History   Chief Complaint Chief Complaint  Patient presents with  . Shortness of Breath    HPI Amber Neal is a 41 y.o. female.  HPI Patient presents to the emergency room for evaluation of cough and shortness of breath. The patient states she's had a cough for the past week. The cough has been nonproductive. She denies having any trouble with fevers or chills. She was at work today when she began having a persistent coughing attack. She was unable to catch her breath. She was not exposed to any chemicals. Nothing in particular triggered the coughing attack. EMS was called and they found that she was wheezing. She was given Solu-Medrol and an albuterol treatment prior to arrival. Patient states she feels significantly better. She doesn't feel 100% back to normal but is not having any significant issues right now and does not feel short of breath. She denies any leg swelling. No recent trips or travel. No history of heart or lung disease. She does have seasonal allergies.  Pt denies that she could be pregnant. Past Medical History:  Diagnosis Date  . Headache(784.0)    migraines    There are no active problems to display for this patient.   Past Surgical History:  Procedure Laterality Date  . LAPAROSCOPY  02/01/2011   Procedure: LAPAROSCOPY OPERATIVE;  Surgeon: Fermin Schwab;  Location: [facility];  Service: Gynecology;  Laterality: N/A;  Lysis of Adhesions; Left Partial Salpingectomy; Removal of Left Hydrosalpinx; Chromopertubation; Flouroscopy with Cannullization of Right Fallopian Tube  . operative lap  2011    OB History    No data available       Home Medications    Prior to Admission medications   Medication Sig Start Date End Date Taking? Authorizing Provider  Phenyleph-Doxylamine-DM-APAP (VICKS DAYQUIL/NYQUIL CLD & FLU PO) Take 15 mLs by mouth 2 (two) times daily as needed (for  cold symptoms).   Yes Historical Provider, MD  albuterol (PROVENTIL HFA;VENTOLIN HFA) 108 (90 Base) MCG/ACT inhaler Inhale 1-2 puffs into the lungs every 6 (six) hours as needed for wheezing or shortness of breath. 10/27/16   Linwood Dibbles, MD  amoxicillin-clavulanate (AUGMENTIN) 875-125 MG tablet Take 1 tablet by mouth 2 (two) times daily. One po bid x 7 days Patient not taking: Reported on 10/27/2016 10/20/15   Arthor Captain, PA-C  HYDROcodone-acetaminophen (NORCO) 5-325 MG tablet Take 1-2 tablets by mouth every 6 (six) hours as needed for severe pain. Patient not taking: Reported on 10/27/2016 06/10/16   Arthor Captain, PA-C  loratadine (CLARITIN) 10 MG tablet Take 1 tablet (10 mg total) by mouth daily. 10/27/16   Linwood Dibbles, MD  methocarbamol (ROBAXIN) 500 MG tablet Take 2 tablets (1,000 mg total) by mouth 4 (four) times daily. Patient not taking: Reported on 10/27/2016 01/06/13   Renne Crigler, PA-C  naproxen (NAPROSYN) 500 MG tablet Take 1 tablet (500 mg total) by mouth 2 (two) times daily with a meal. Patient not taking: Reported on 10/27/2016 06/10/16   Arthor Captain, PA-C  predniSONE (DELTASONE) 50 MG tablet Take 1 tablet (50 mg total) by mouth daily. 10/27/16   Linwood Dibbles, MD  traMADol (ULTRAM) 50 MG tablet Take 1 tablet (50 mg total) by mouth every 6 (six) hours as needed. Patient not taking: Reported on 10/27/2016 10/20/15   Arthor Captain, PA-C    Family History No family history on file.  Social History  Social History  Substance Use Topics  . Smoking status: Never Smoker  . Smokeless tobacco: Never Used  . Alcohol use No     Comment: less than once a week     Allergies   Latex   Review of Systems Review of Systems  All other systems reviewed and are negative.    Physical Exam Updated Vital Signs BP 106/76 (BP Location: Right Arm)   Pulse (!) 110   Temp 97.9 F (36.6 C) (Oral)   Resp 16   Ht  (1.626 m)   Wt 68 kg   LMP 10/17/2016   SpO2 100%   BMI 25.75 kg/m    Physical Exam  Constitutional: She appears well-developed and well-nourished. No distress.  HENT:  Head: Normocephalic and atraumatic.  Right Ear: External ear normal.  Left Ear: External ear normal.  Eyes: Conjunctivae are normal. Right eye exhibits no discharge. Left eye exhibits no discharge. No scleral icterus.  Neck: Neck supple. No tracheal deviation present.  Cardiovascular: Normal rate, regular rhythm and intact distal pulses.   Pulmonary/Chest: Effort normal and breath sounds normal. No stridor. No respiratory distress. She has no wheezes. She has no rales.  Abdominal: Soft. Bowel sounds are normal. She exhibits no distension. There is no tenderness. There is no rebound and no guarding.  Musculoskeletal: She exhibits no edema or tenderness.  Neurological: She is alert. She has normal strength. No cranial nerve deficit (no facial droop, extraocular movements intact, no slurred speech) or sensory deficit. She exhibits normal muscle tone. She displays no seizure activity. Coordination normal.  Skin: Skin is warm and dry. No rash noted.  Psychiatric: She has a normal mood and affect.  Nursing note and vitals reviewed.    ED Treatments / Results  Labs (all labs ordered are listed, but only abnormal results are displayed) Labs Reviewed  BASIC METABOLIC PANEL - Abnormal; Notable for the following:       Result Value   Potassium 2.8 (*)    Glucose, Bld 103 (*)    All other components within normal limits  D-DIMER, QUANTITATIVE (NOT AT [facility]) - Abnormal; Notable for the following:    D-Dimer, Quant 0.59 (*)    All other components within normal limits  CBC    EKG  EKG Interpretation None       Radiology Dg Chest 2 View  Result Date: 10/27/2016 CLINICAL DATA:  Shortness of breath, cough for 1 week EXAM: CHEST  2 VIEW COMPARISON:  05/17/2013 FINDINGS: The heart size and mediastinal contours are within normal limits. Both lungs are clear. The visualized skeletal structures  are unremarkable. IMPRESSION: No active cardiopulmonary disease. Electronically Signed   By: Elige Ko   On: 10/27/2016 09:53   Ct Angio Chest Pe W And/or Wo Contrast  Result Date: 10/27/2016 CLINICAL DATA:  Wheezing reported for 1 week. Improved with a breathing treatment. EXAM: CT ANGIOGRAPHY CHEST WITH CONTRAST TECHNIQUE: Multidetector CT imaging of the chest was performed using the standard protocol during bolus administration of intravenous contrast. Multiplanar CT image reconstructions and MIPs were obtained to evaluate the vascular anatomy. CONTRAST:  100 mL of Isovue 370 intravenous contrast COMPARISON:  None. FINDINGS: Cardiovascular: Satisfactory opacification of the pulmonary arteries to the segmental level. No evidence of pulmonary embolism. Normal heart size. No pericardial effusion. No coronary artery calcifications. The great vessels are unremarkable. Mediastinum/Nodes: No enlarged mediastinal, hilar, or axillary lymph nodes. Thyroid gland, trachea, and esophagus demonstrate no significant findings. Lungs/Pleura: Lungs are clear. No  pleural effusion or pneumothorax. Upper Abdomen: Unremarkable. Musculoskeletal: Normal. Review of the MIP images confirms the above findings. IMPRESSION: 1. No evidence of a pulmonary embolus. 2. Normal exam. Electronically Signed   By: Amie Portland M.D.   On: 10/27/2016 11:26    Procedures Procedures (including critical care time)  Medications Ordered in ED Medications  loratadine (CLARITIN) tablet 10 mg (10 mg Oral Given 10/27/16 1002)  iopamidol (ISOVUE-370) 76 % injection (not administered)  iopamidol (ISOVUE-370) 76 % injection 100 mL (100 mLs Intravenous Contrast Given 10/27/16 1108)     Initial Impression / Assessment and Plan / ED Course  I have reviewed the triage vital signs and the nursing notes.  Pertinent labs & imaging results that were available during my care of the patient were reviewed by me and considered in my medical decision  making (see chart for details).  Clinical Course as of Oct 27 1144  Sun Oct 27, 2016  0930 D Dimer ordered: Perc positive, no wheezing on my exam  [JK]  1038 Will ct chest considering elevated d dimer.   Potassium likely related to her neb treatments  [JK]  1145 Repeat lung exam: No wheezing  [JK]    Clinical Course User Index [JK] Linwood Dibbles, MD   CT scan negative for acute PE.  Suspect her symptoms are related to bronchospasm possibly related to seasonal allergies.   I will discharge her home with a prescription for albuterol inhaler, prednisone and Claritin. I discussed follow-up with the primary care doctor.  Final Clinical Impressions(s) / ED Diagnoses   Final diagnoses:  Bronchospasm, acute    New Prescriptions New Prescriptions   ALBUTEROL (PROVENTIL HFA;VENTOLIN HFA) 108 (90 BASE) MCG/ACT INHALER    Inhale 1-2 puffs into the lungs every 6 (six) hours as needed for wheezing or shortness of breath.   LORATADINE (CLARITIN) 10 MG TABLET    Take 1 tablet (10 mg total) by mouth daily.   PREDNISONE (DELTASONE) 50 MG TABLET    Take 1 tablet (50 mg total) by mouth daily.     Linwood Dibbles, MD 10/27/16 1146

## 2016-11-13 DIAGNOSIS — J309 Allergic rhinitis, unspecified: Secondary | ICD-10-CM | POA: Diagnosis not present

## 2017-02-20 ENCOUNTER — Ambulatory Visit (INDEPENDENT_AMBULATORY_CARE_PROVIDER_SITE_OTHER): Payer: BLUE CROSS/BLUE SHIELD | Admitting: Allergy & Immunology

## 2017-02-20 ENCOUNTER — Encounter: Payer: Self-pay | Admitting: Allergy & Immunology

## 2017-02-20 VITALS — BP 98/66 | HR 75 | Temp 98.9°F | Resp 18 | Ht 62.5 in | Wt 158.6 lb

## 2017-02-20 DIAGNOSIS — J302 Other seasonal allergic rhinitis: Secondary | ICD-10-CM

## 2017-02-20 DIAGNOSIS — J3089 Other allergic rhinitis: Secondary | ICD-10-CM | POA: Diagnosis not present

## 2017-02-20 DIAGNOSIS — J4540 Moderate persistent asthma, uncomplicated: Secondary | ICD-10-CM

## 2017-02-20 DIAGNOSIS — T781XXD Other adverse food reactions, not elsewhere classified, subsequent encounter: Secondary | ICD-10-CM

## 2017-02-20 MED ORDER — BUDESONIDE-FORMOTEROL FUMARATE 160-4.5 MCG/ACT IN AERO: 2.0000 | INHALATION_SPRAY | Freq: Two times a day (BID) | RESPIRATORY_TRACT | 5 refills | Status: DC

## 2017-02-20 MED ORDER — AZELASTINE-FLUTICASONE 137-50 MCG/ACT NA SUSP: 2.0000 | Freq: Every day | NASAL | 5 refills | Status: DC

## 2017-02-20 MED ORDER — LEVOCETIRIZINE DIHYDROCHLORIDE 5 MG PO TABS: 5.0000 mg | ORAL_TABLET | Freq: Every evening | ORAL | 5 refills | Status: DC

## 2017-02-20 NOTE — Progress Notes (Signed)
NEW PATIENT  Date of Service/Encounter:  02/20/17  Referring provider: [facility]   Assessment:   Moderate persistent asthma - suspected  Seasonal and perennial allergic rhinitis (trees, weeds, grasses, molds and cat)  Adverse food reaction - with negative testing to the most common foods   Asthma Reportables:  Severity: moderate persistent  Risk: high Control: not well controlled    Plan/Recommendations:   1. Moderate persistent asthma, uncomplicated - Lung function was normal, but you did improve slightly with the nebulizer treatment.  - The picture is not entirely consistent with asthma, but there is certainly some hyperreactivity to the airways. - There is a possibility that more of her symptoms are related to uncontrolled allergic disease in the upper airways, but we will need to control both before decreasing her asthma medications.  - I would like to start a combined inhaled steroid and long acting for of albuterol to see if this helps your symptoms: Symbicort 160/4.5 two puffs in the morning and two puffs at night. - I do not want you using prednisone routinely, which is absorbed into the body and causes some bad side effects. - Daily controller medication(s): Symbicort 160/4.5 two puffs twice daily with spacer - Rescue medications: ProAir 4 puffs every 4-6 hours as needed - Asthma control goals:  * Full participation in all desired activities (may need albuterol before activity) * Albuterol use two time or less a week on average (not counting use with activity) * Cough interfering with sleep two time or less a month * Oral steroids no more than once a year * No hospitalizations  2. Seasonal and perennial allergic rhinitis - Testing today showed: trees, weeds, grasses, molds and cat - Avoidance measures provided. - Stop the Zyrtec and the ipratropium nasal spray.  - Start Xyzal (levocetirizine) 5mg  tablet once daily and Dymista  (fluticasone/azelastine) two sprays per nostril 1-2 times daily as needed - You can use an extra dose of the antihistamine, if needed, for breakthrough symptoms.  - Consider nasal saline rinses 1-2 times daily to remove allergens from the nasal cavities as well as help with mucous clearance (this is especially helpful to do before the nasal sprays are given) - Consider allergy shots as a means of long-term control. - Allergy shots "re-3train" the immune system to ignore environmental allergens and decrease the resulting immune response to those allergens (sneezing, itchy watery eyes, runny nose, nasal congestion, etc).   - We can discuss more at the next appointment if the medications are not working for you.  3. Adverse food reaction - Testing to the most common foods was negative (peanut, cashew, soy, fish mix, shellfish mix, wheat, milk, egg) - There is a the low positive predictive value of food allergy testing and hence the high possibility of false positives. - In contrast, food allergy testing has a high negative predictive value, therefore if testing is negative we can be relatively assured that they are indeed negative.  - These rule out 95% or more of all food allergies.   4. Return in about 2 months (around 04/22/2017).   Subjective:   Jaryia Hyams is a 41 y.o. female presenting today for evaluation of  Chief Complaint  Patient presents with  . Asthma  . Allergies    Maudena Bontemps has a history of the following: Patient Active Problem List   Diagnosis Date Noted  . Seasonal and perennial allergic rhinitis 02/20/2017  . Moderate persistent asthma, uncomplicated 02/20/2017  History obtained from: chart review and patient.  Shirleen Schirmer was referred by [facility].     Silas is a 41 y.o. female presenting for an evaluation of asthma and allergies. This is her first time being evaluated by an allergist.   She has had problems with allergy symptoms since she moved  to the [place]. She moved here in 2000 from [place], but she has been able to deal with them fine until recently. This past year, she has had worsening problems. She had coughing and whatnot prior to this year, but it was markedly worse this year. Currently she is on albuterol as needed. She was seen in April 2018 and she was admitted to the hospital. Her symptoms started with one week of coughing that acutely worsened, necessitating an EMS call and ambulance transfer to the ED. In the field, she was given IV steroids as well as an albuterol treatment with improvement by the time she got to the ED. She did a CT angio which was negative for a PE. She was prescribed prednisone when she was discharged from the hospital in April, which did help. Then she got some more prednisone when she went to see her PCP at some point since that ED visit. However, it seems that she never took the prednisone as prescribed, but stored it up. She is now taking it as needed when she starts to feel short of breath. She has never been on a daily controller medication for her asthma.   She does endorse itchy watery eyes and coughing when the allergy symptoms starts. She takes Benadryl which does help as well as Claritin. She has taken a nasal spray as well (Atrovent per the records). She is also on Singulair. She has never been allergy tested. Symptoms do resolve in the winter.   She does have intermittent itching on her upper arms. She has some hyperpigmented lesions on his upper arms. She has seen Dermatology where she was given an ointment of some type. She has some food sensitivities, but she has never paid attention to which foods cause which symptoms. Her symptoms overall are vague and have never required any intervention. Otherwise, there is no history of other atopic diseases, including drug allergies, stinging insect allergies, or urticaria. There is no significant infectious history. Vaccinations are up to date.    Past Medical  History: Patient Active Problem List   Diagnosis Date Noted  . Seasonal and perennial allergic rhinitis 02/20/2017  . Moderate persistent asthma, uncomplicated 02/20/2017    Medication List:  Allergies as of 02/20/2017      Reactions   Latex Itching      Medication List       Accurate as of 02/20/17 12:21 PM. Always use your most recent med list.          albuterol 108 (90 Base) MCG/ACT inhaler Commonly known as:  PROVENTIL HFA;VENTOLIN HFA Inhale 1-2 puffs into the lungs every 6 (six) hours as needed for wheezing or shortness of breath.   ipratropium 0.03 % nasal spray Commonly known as:  ATROVENT   loratadine 10 MG tablet Commonly known as:  CLARITIN Take 1 tablet (10 mg total) by mouth daily.   montelukast 10 MG tablet Commonly known as:  SINGULAIR   predniSONE 50 MG tablet Commonly known as:  DELTASONE Take 1 tablet (50 mg total) by mouth daily.   VICKS DAYQUIL/NYQUIL CLD & FLU PO Take 15 mLs by mouth 2 (two) times daily as  needed (for cold symptoms).            Discharge Care Instructions        Start     Ordered   02/20/17 0000  Spirometry with Graph    Question Answer Comment  Where should this test be performed? Other   Basic spirometry Yes   Spirometry pre & post bronchodilator Yes      02/20/17 1205   02/20/17 0000  Allergy Test    Question:  Allergy test to perform  Answer:  1-69   02/20/17 1205      Birth History: non-contributory.  Developmental History: non-contributory.   Past Surgical History: Past Surgical History:  Procedure Laterality Date  . LAPAROSCOPY  02/01/2011   Procedure: LAPAROSCOPY OPERATIVE;  Surgeon: Fermin Schwab;  Location: [facility];  Service: Gynecology;  Laterality: N/A;  Lysis of Adhesions; Left Partial Salpingectomy; Removal of Left Hydrosalpinx; Chromopertubation; Flouroscopy with Cannullization of Right Fallopian Tube  . operative lap  2011     Family History: Family History  Problem Relation Age of  Onset  . Asthma Mother   . Asthma Maternal Grandmother   . Allergic rhinitis Neg Hx   . Angioedema Neg Hx   . Eczema Neg Hx   . Immunodeficiency Neg Hx   . Urticaria Neg Hx      Social History: Georgena lives at home in an apartment. There is carpeting throughout the apartment. They have electric heating and central cooling. There are neighbors with dogs, but otherwise no pet exposures. She does not have dust mite coverings for the bedding. There is tobacco exposure in the home. She currently works as a Lawyer for two years.     Review of Systems: a 14-point review of systems is pertinent for what is mentioned in HPI.  Otherwise, all other systems were negative. Constitutional: negative other than that listed in the HPI Eyes: negative other than that listed in the HPI Ears, nose, mouth, throat, and face: negative other than that listed in the HPI Respiratory: negative other than that listed in the HPI Cardiovascular: negative other than that listed in the HPI Gastrointestinal: negative other than that listed in the HPI Genitourinary: negative other than that listed in the HPI Integument: negative other than that listed in the HPI Hematologic: negative other than that listed in the HPI Musculoskeletal: negative other than that listed in the HPI Neurological: negative other than that listed in the HPI Allergy/Immunologic: negative other than that listed in the HPI    Objective:   Blood pressure 98/66, pulse 75, temperature 98.9 F (37.2 C), temperature source Oral, resp. rate 18, height 5' 2.5" (1.588 m), weight 158 lb 9.6 oz (71.9 kg), SpO2 98 %. Body mass index is 28.55 kg/m.   Physical Exam:  General: Alert, interactive, in no acute distress. Very pleasant and cooperative with the exam. Friendly.  Eyes: No conjunctival injection present on the right, No conjunctival injection present on the left, PERRL bilaterally, No discharge on the right, No discharge on the left and No  Horner-Trantas dots present Ears: Right TM pearly gray with normal light reflex, Left TM pearly gray with normal light reflex, Right OME and Left OME.  Nose/Throat: External nose within normal limits, nasal crease present and septum midline, turbinates markedly edematous with clear discharge, post-pharynx markedly erythematous with cobblestoning in the posterior oropharynx. Tonsils 2+ without exudates Neck: Supple without thyromegaly.  Adenopathy: Shoddy bilateral anterior cervical lymphadenopathy. and No enlarged lymph nodes appreciated in the occipital,  axillary, epitrochlear, inguinal, or popliteal regions. Lungs: Clear to auscultation without wheezing, rhonchi or rales. No increased work of breathing. CV: Normal S1/S2, no murmurs. Capillary refill <2 seconds.  Abdomen: Nondistended, nontender. No guarding or rebound tenderness. Bowel sounds present in all fields and hypoactive  Skin: Warm and dry, without lesions or rashes. There are some hyperpigmented lesions on her upper arms, likely secondary to scratching.  Extremities:  No clubbing, cyanosis or edema. Neuro:   Grossly intact. No focal deficits appreciated. Responsive to questions.  Diagnostic studies:   Spirometry: results normal (FEV1: 2.27/83%, FVC: 2.80/86%, FEV1/FVC: 81%).    Spirometry consistent with normal pattern. Albuterol/Atrovent nebulizer treatment given in clinic with significant improvement, but only in the FEF25-75% with an increase of 23%. The FEV1 and FVC remained stable.   Allergy Studies:   Indoor/Outdoor Percutaneous Adult Environmental Panel: positive to bahia grass, [place] grass, short ragweed, sheep sorrel, rough pigweed, birch, American beech, red cedar, [place] cottonwood, elm, Alternaria and Cladosporium. Otherwise negative with adequate controls.  Indoor/Outdoor Selected Intradermal Environmental Panel: positive to mold mix #3 and cat. Otherwise negative with adequate controls.  Most Common Foods Panel  (peanut, cashew, soy, fish mix, shellfish mix, wheat, milk, egg): negative to all with adequate controls     Malachi Bonds, MD FAAAAI [facility]

## 2017-02-20 NOTE — Patient Instructions (Addendum)
1. Moderate persistent asthma, uncomplicated - Lung function was normal, but you did improve slightly with the nebulizer treatment.  - I would like to start a combined inhaled steroid and long acting for of albuterol to see if this helps your symptoms: Symbicort 160/4.5 two puffs in the morning and two puffs at night. - I do not want you using prednisone routinely, which is absorbed into the body and causes some bad side effects. - Daily controller medication(s): Symbicort 160/4.5 two puffs twice daily with spacer - Rescue medications: ProAir 4 puffs every 4-6 hours as needed - Asthma control goals:  * Full participation in all desired activities (may need albuterol before activity) * Albuterol use two time or less a week on average (not counting use with activity) * Cough interfering with sleep two time or less a month * Oral steroids no more than once a year * No hospitalizations  2. Seasonal and perennial allergic rhinitis - Testing today showed: trees, weeds, grasses, molds and cat - Avoidance measures provided. - Stop the Zyrtec and the ipratropium nasal spray.  - Start Xyzal (levocetirizine) 5mg  tablet once daily and Dymista (fluticasone/azelastine) two sprays per nostril 1-2 times daily as needed - You can use an extra dose of the antihistamine, if needed, for breakthrough symptoms.  - Consider nasal saline rinses 1-2 times daily to remove allergens from the nasal cavities as well as help with mucous clearance (this is especially helpful to do before the nasal sprays are given) - Consider allergy shots as a means of long-term control. - Allergy shots "re-3train" the immune system to ignore environmental allergens and decrease the resulting immune response to those allergens (sneezing, itchy watery eyes, runny nose, nasal congestion, etc).   - We can discuss more at the next appointment if the medications are not working for you.  3. Adverse food reaction - Testing to the most common  foods was negative (peanut, cashew, soy, fish mix, shellfish mix, wheat, milk, egg) - There is a the low positive predictive value of food allergy testing and hence the high possibility of false positives. - In contrast, food allergy testing has a high negative predictive value, therefore if testing is negative we can be relatively assured that they are indeed negative.  - These rule out 95% or more of all food allergies.   4. Return in about 2 months (around 04/22/2017).    Please inform us of any Emergency Department visits, hospitalizations, or changes in symptoms. Call us before going to the ED for breathing or allergy symptoms since we might be able to fit you in for a sick visit. Feel free to contact us anytime with any questions, problems, or concerns.  It was a pleasure to meet you today! Enjoy the rest of your summer!   Websites that have reliable patient information: 1. American Academy of Asthma, Allergy, and Immunology: www.aaaai.org 2. Food Allergy Research and Education (FARE): foodallergy.org 3. Mothers of Asthmatics: http://www.asthmacommunitynetwork.org 4. American College of Allergy, Asthma, and Immunology: www.acaai.org   Election Day is coming up on Tuesday, November 6th! Make your voice heard! Register to vote at [email or web address]!     Reducing Pollen Exposure  The American Academy of Allergy, Asthma and Immunology suggests the following steps to reduce your exposure to pollen during allergy seasons.    1. Do not hang sheets or clothing out to dry; pollen may collect on these items. 2. Do not mow lawns or spend time around freshly cut grass; mowing stirs up pollen.  3. Keep windows closed at night.  Keep car windows closed while driving. 4. Minimize morning activities outdoors, a time when pollen counts are usually at their highest. 5. Stay indoors as much as possible when pollen counts or humidity is high and on windy days when pollen tends to remain in the air  longer. 6. Use air conditioning when possible.  Many air conditioners have filters that trap the pollen spores. 7. Use a HEPA room air filter to remove pollen form the indoor air you breathe.  Control of Mold Allergen  Mold and fungi can grow on a variety of surfaces provided certain temperature and moisture conditions exist.  Outdoor molds grow on plants, decaying vegetation and soil.  The major outdoor mold, Alternaria and Cladosporium, are found in very high numbers during hot and dry conditions.  Generally, a late Summer - Fall peak is seen for common outdoor fungal spores.  Rain will temporarily lower outdoor mold spore count, but counts rise rapidly when the rainy period ends.  The most important indoor molds are Aspergillus and Penicillium.  Dark, humid and poorly ventilated basements are ideal sites for mold growth.  The next most common sites of mold growth are the bathroom and the kitchen.  Outdoor [employer] 1. Use air conditioning and keep windows closed 2. Avoid exposure to decaying vegetation. 3. Avoid leaf raking. 4. Avoid grain handling. 5. Consider wearing a face mask if working in moldy areas.  Indoor Mold Control 1. Maintain humidity below 50%. 2. Clean washable surfaces with 5% bleach solution. 3. Remove sources e.g. contaminated carpets.  Control of Dog or Cat Allergen  Avoidance is the best way to manage a dog or cat allergy. If you have a dog or cat and are allergic to dog or cats, consider removing the dog or cat from the home. If you have a dog or cat but don't want to find it a new home, or if your family wants a pet even though someone in the household is allergic, here are some strategies that may help keep symptoms at bay:  1. Keep the pet out of your bedroom and restrict it to only a few rooms. Be advised that keeping the dog or cat in only one room will not limit the allergens to that room. 2. Don't pet, hug or kiss the dog or cat; if you do, wash your hands  with soap and water. 3. High-efficiency particulate air (HEPA) cleaners run continuously in a bedroom or living room can reduce allergen levels over time. 4. Regular use of a high-efficiency vacuum cleaner or a central vacuum can reduce allergen levels. 5. Giving your dog or cat a bath at least once a week can reduce airborne allergen.

## 2017-04-24 ENCOUNTER — Ambulatory Visit: Payer: BLUE CROSS/BLUE SHIELD | Admitting: Allergy & Immunology

## 2017-04-30 ENCOUNTER — Ambulatory Visit: Payer: BLUE CROSS/BLUE SHIELD | Admitting: Allergy & Immunology

## 2017-05-01 ENCOUNTER — Ambulatory Visit: Payer: BLUE CROSS/BLUE SHIELD | Admitting: Allergy & Immunology

## 2017-05-01 DIAGNOSIS — J309 Allergic rhinitis, unspecified: Secondary | ICD-10-CM

## 2017-06-02 ENCOUNTER — Ambulatory Visit: Payer: BLUE CROSS/BLUE SHIELD | Admitting: Allergy & Immunology

## 2017-06-04 IMAGING — CT CT ANGIO CHEST
2 of 7 series · 19 of 36 positions shown · IV contrast (isovue)
Comparison: None.

CLINICAL DATA: Wheezing reported for 1 week. Improved with a
breathing treatment.

EXAM:
CT ANGIOGRAPHY CHEST WITH CONTRAST
TECHNIQUE: Multidetector CT imaging of the chest was performed using the
standard protocol during bolus administration of intravenous
contrast. Multiplanar CT image reconstructions and MIPs were
obtained to evaluate the vascular anatomy.
CONTRAST:  100 mL of Isovue 370 intravenous contrast

[Series 6: thins for pacs · axial · 0.62mm/px · z∈[-346,-94]mm · 18 of 280 slices shown]
[im 14/280  lung]
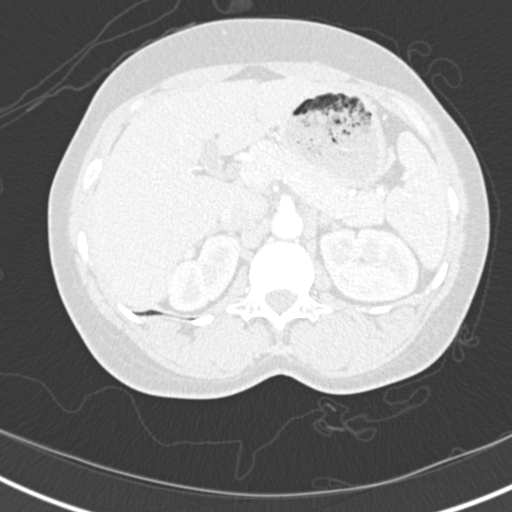
[im 28/280  mediastinal]
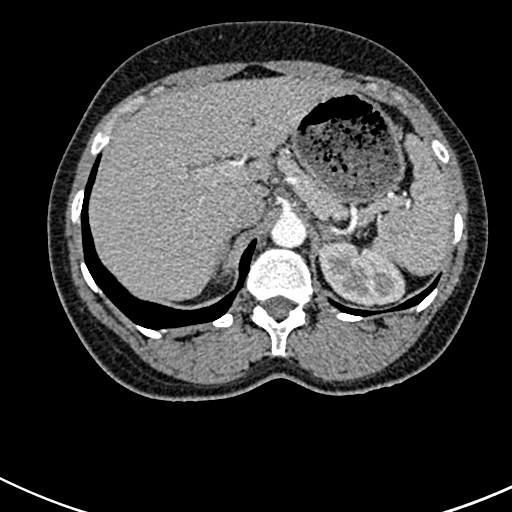
[im 42/280  lung]
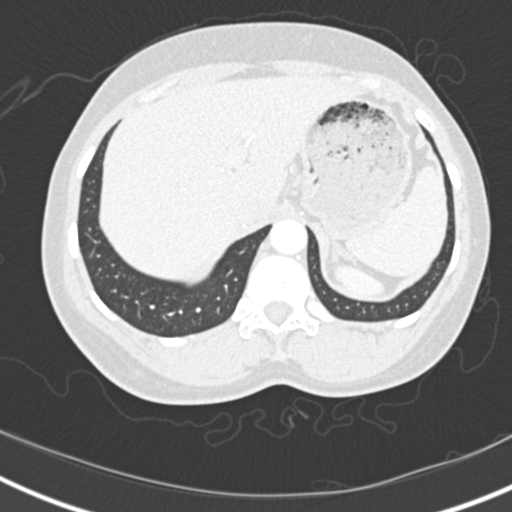
[im 56/280  mediastinal]
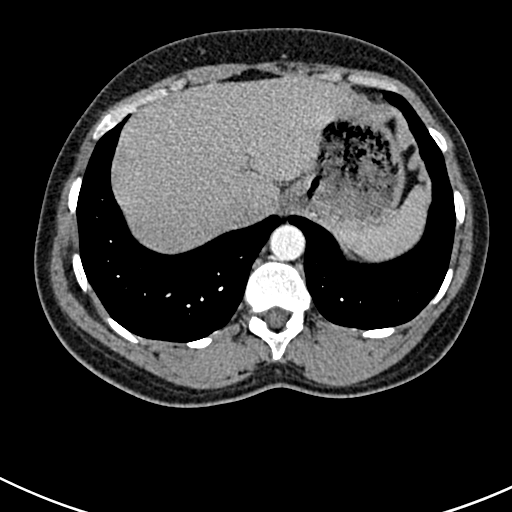
[im 70/280  lung]
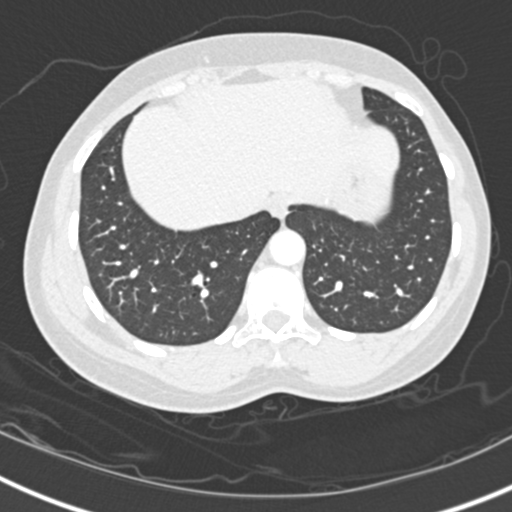
[im 84/280  mediastinal]
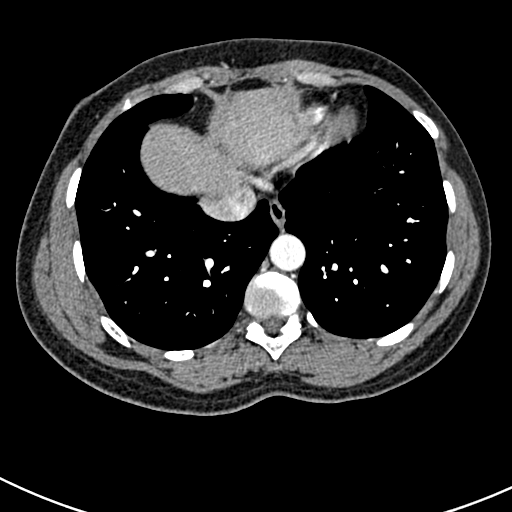
[im 98/280  lung]
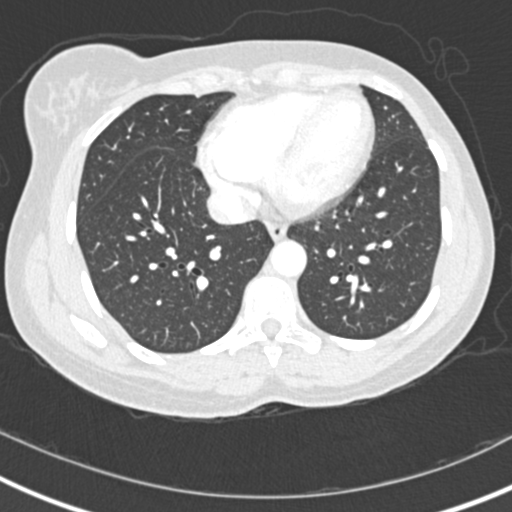
[im 112/280  mediastinal]
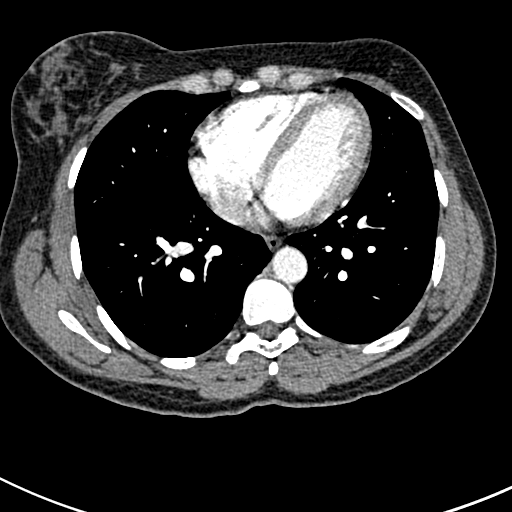
[im 126/280  lung]
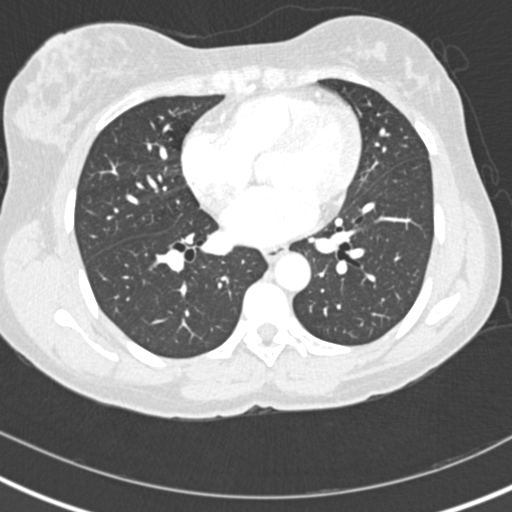
[im 154/280  mediastinal]
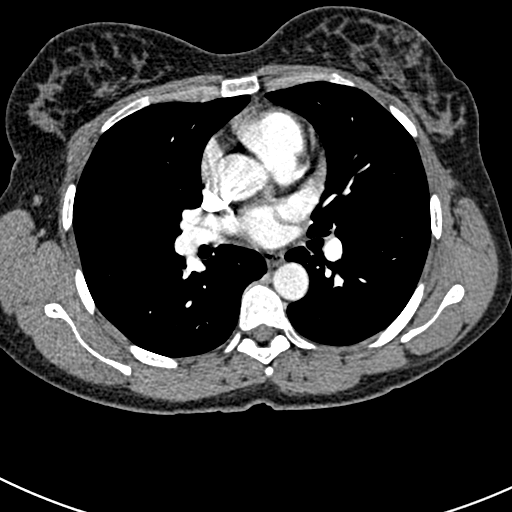
[im 168/280  lung]
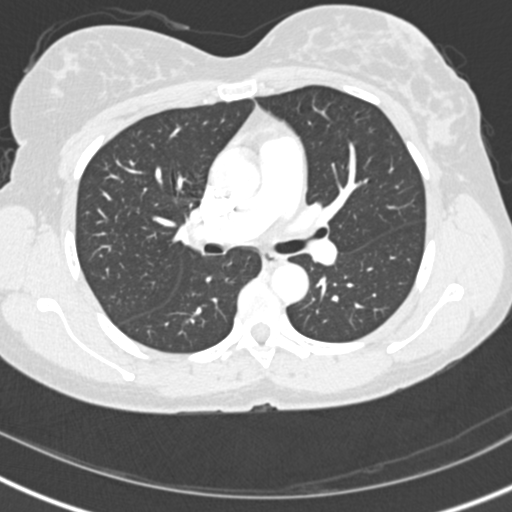
[im 182/280  mediastinal]
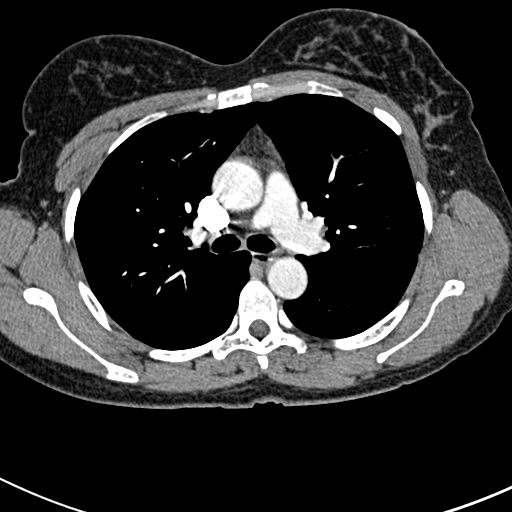
[im 196/280  lung]
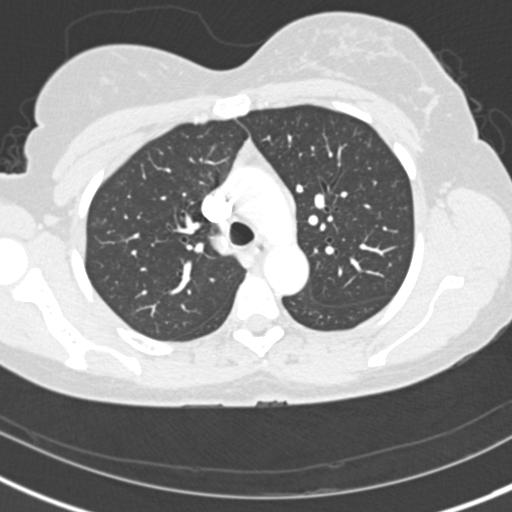
[im 210/280  mediastinal]
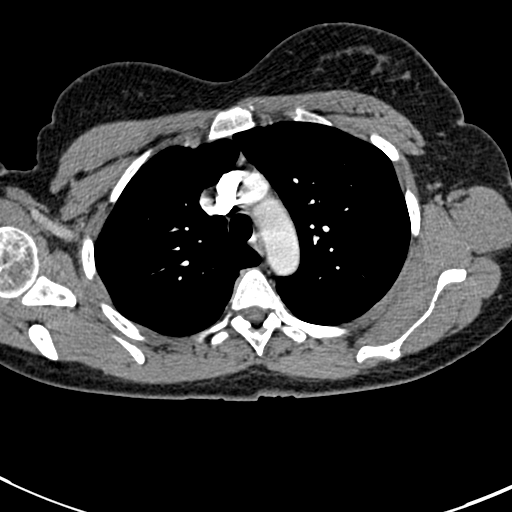
[im 224/280  lung]
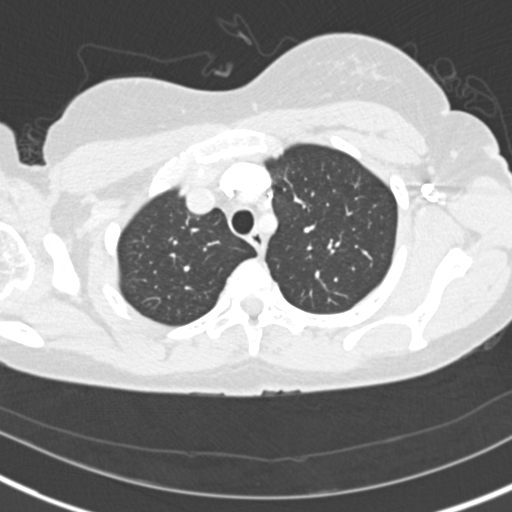
[im 238/280  mediastinal]
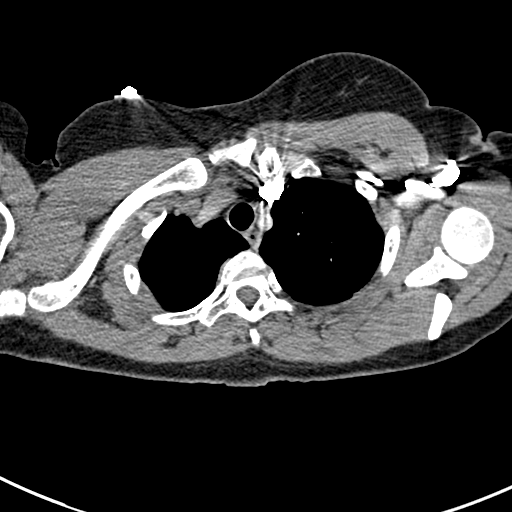
[im 252/280  lung]
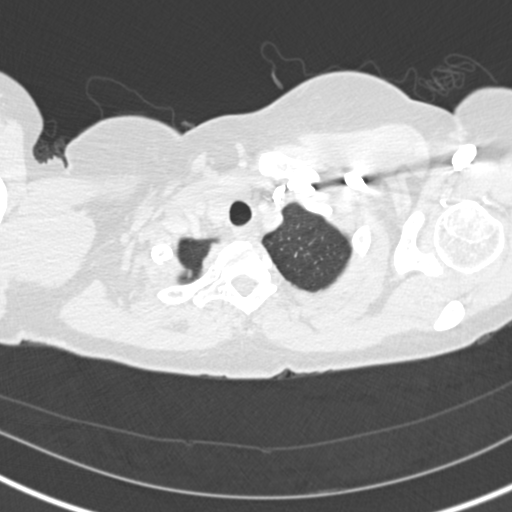
[im 266/280  mediastinal]
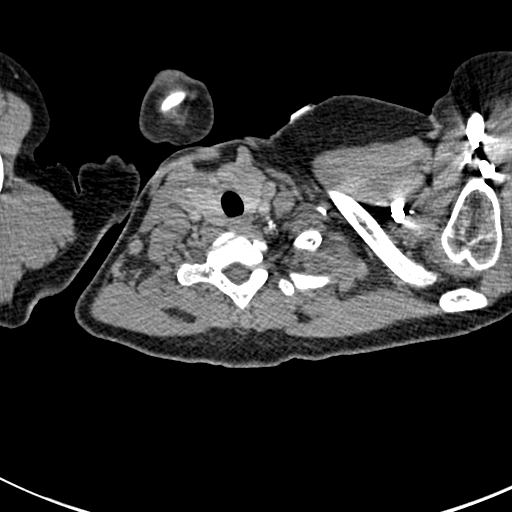

[Series 8: coronal mpr · coronal · 0.59mm/px · 1 of 117 slices shown]
[im 59/117  mediastinal]
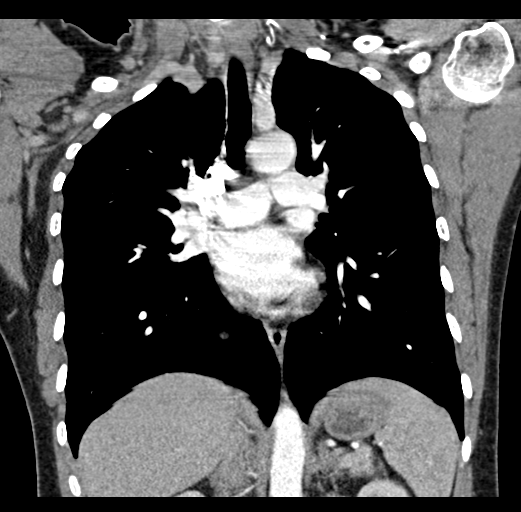

[19 of 36 positions shown; findings below may reference images not displayed]

FINDINGS: Cardiovascular: Satisfactory opacification of the pulmonary arteries
to the segmental level. No evidence of pulmonary embolism. Normal
heart size. No pericardial effusion. No coronary artery
calcifications. The great vessels are unremarkable.

Mediastinum/Nodes: No enlarged mediastinal, hilar, or axillary lymph
nodes. Thyroid gland, trachea, and esophagus demonstrate no
significant findings.

Lungs/Pleura: Lungs are clear. No pleural effusion or pneumothorax.

Upper Abdomen: Unremarkable.

Musculoskeletal: Normal.

Review of the MIP images confirms the above findings.
IMPRESSION: 1. No evidence of a pulmonary embolus.
2. Normal exam.

## 2017-06-04 IMAGING — CR DG CHEST 2V
2 series · 2 of 2 positions shown · non-contrast
Comparison: 05/17/2013

CLINICAL DATA: Shortness of breath, cough for 1 week

EXAM:
CHEST  2 VIEW

[w chest pa]
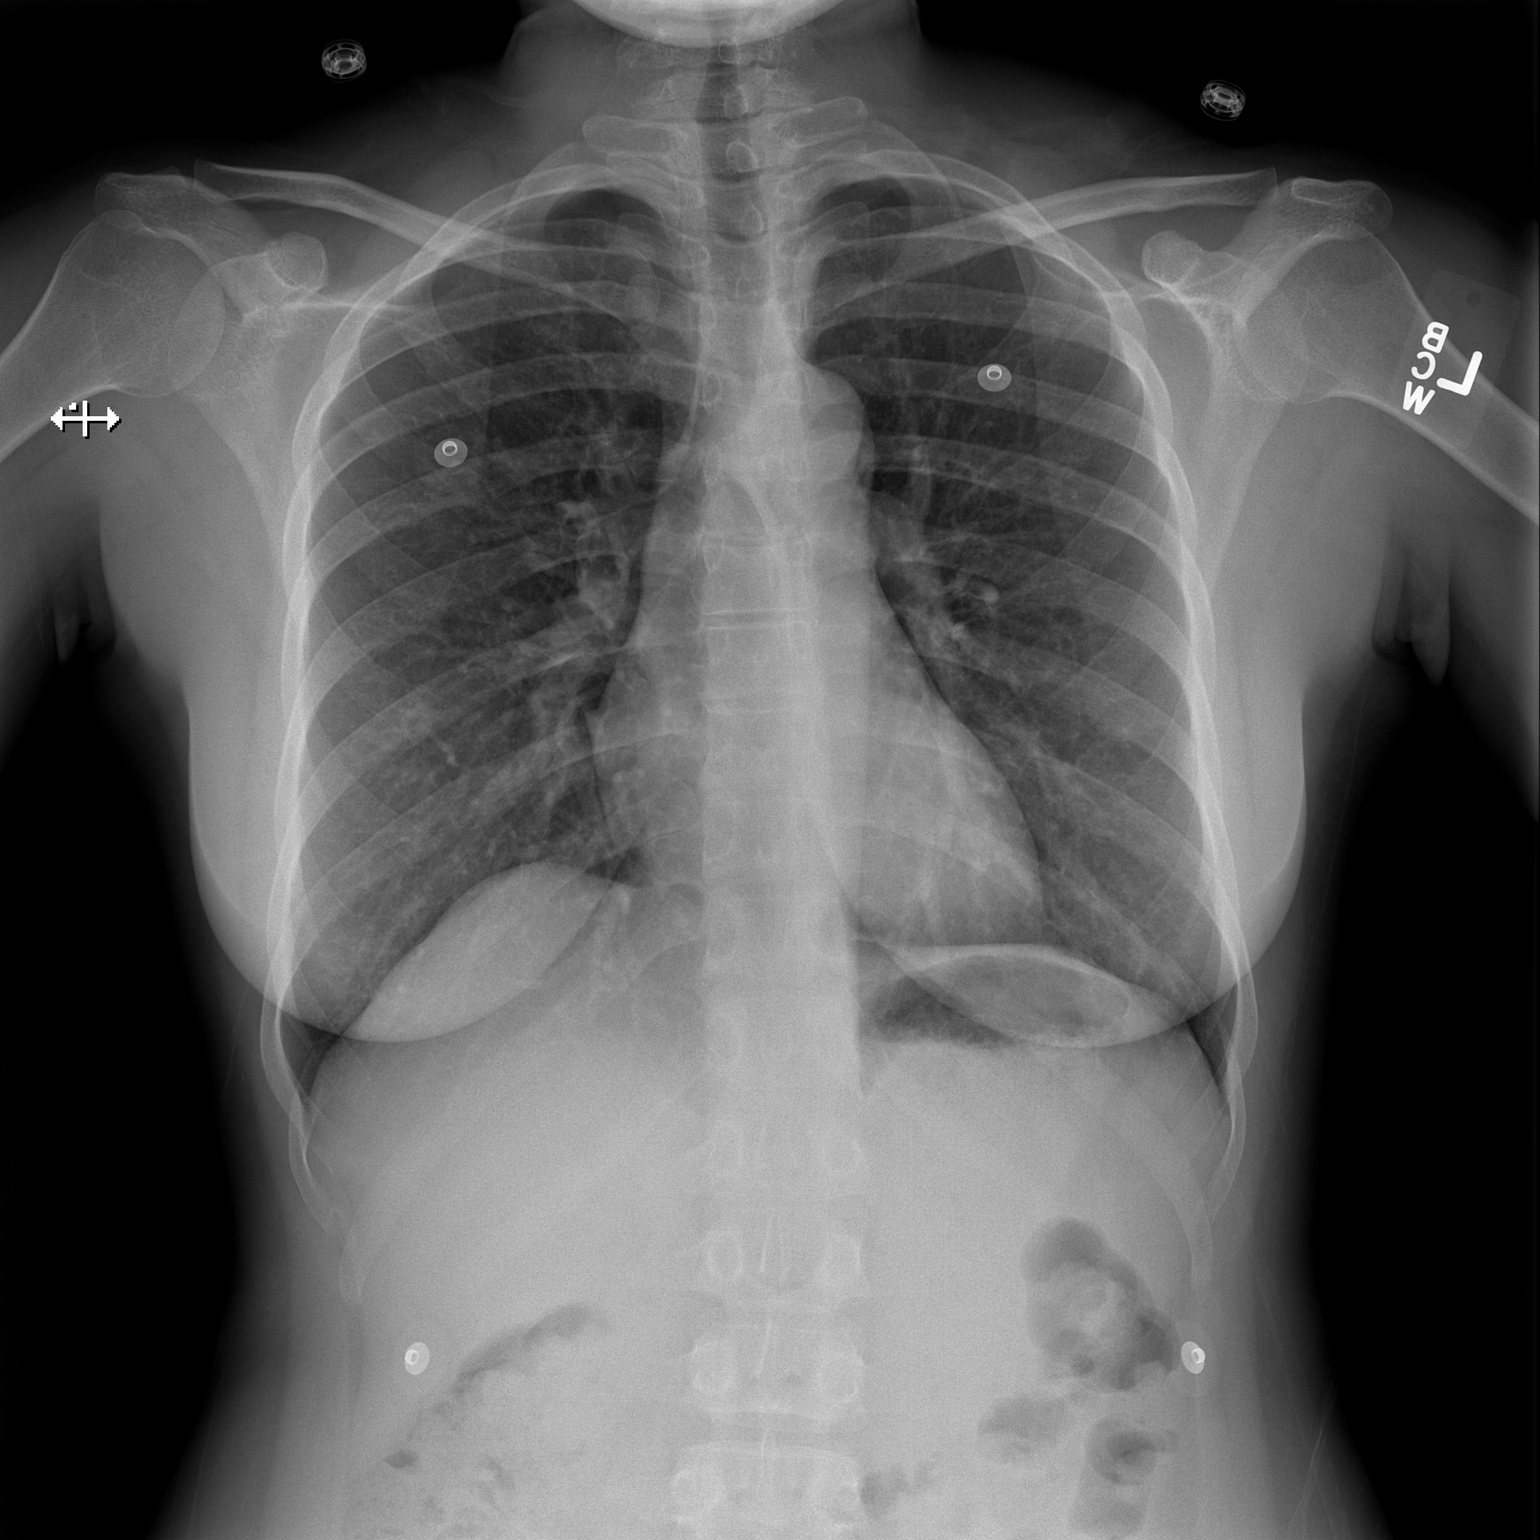

[w chest lat]
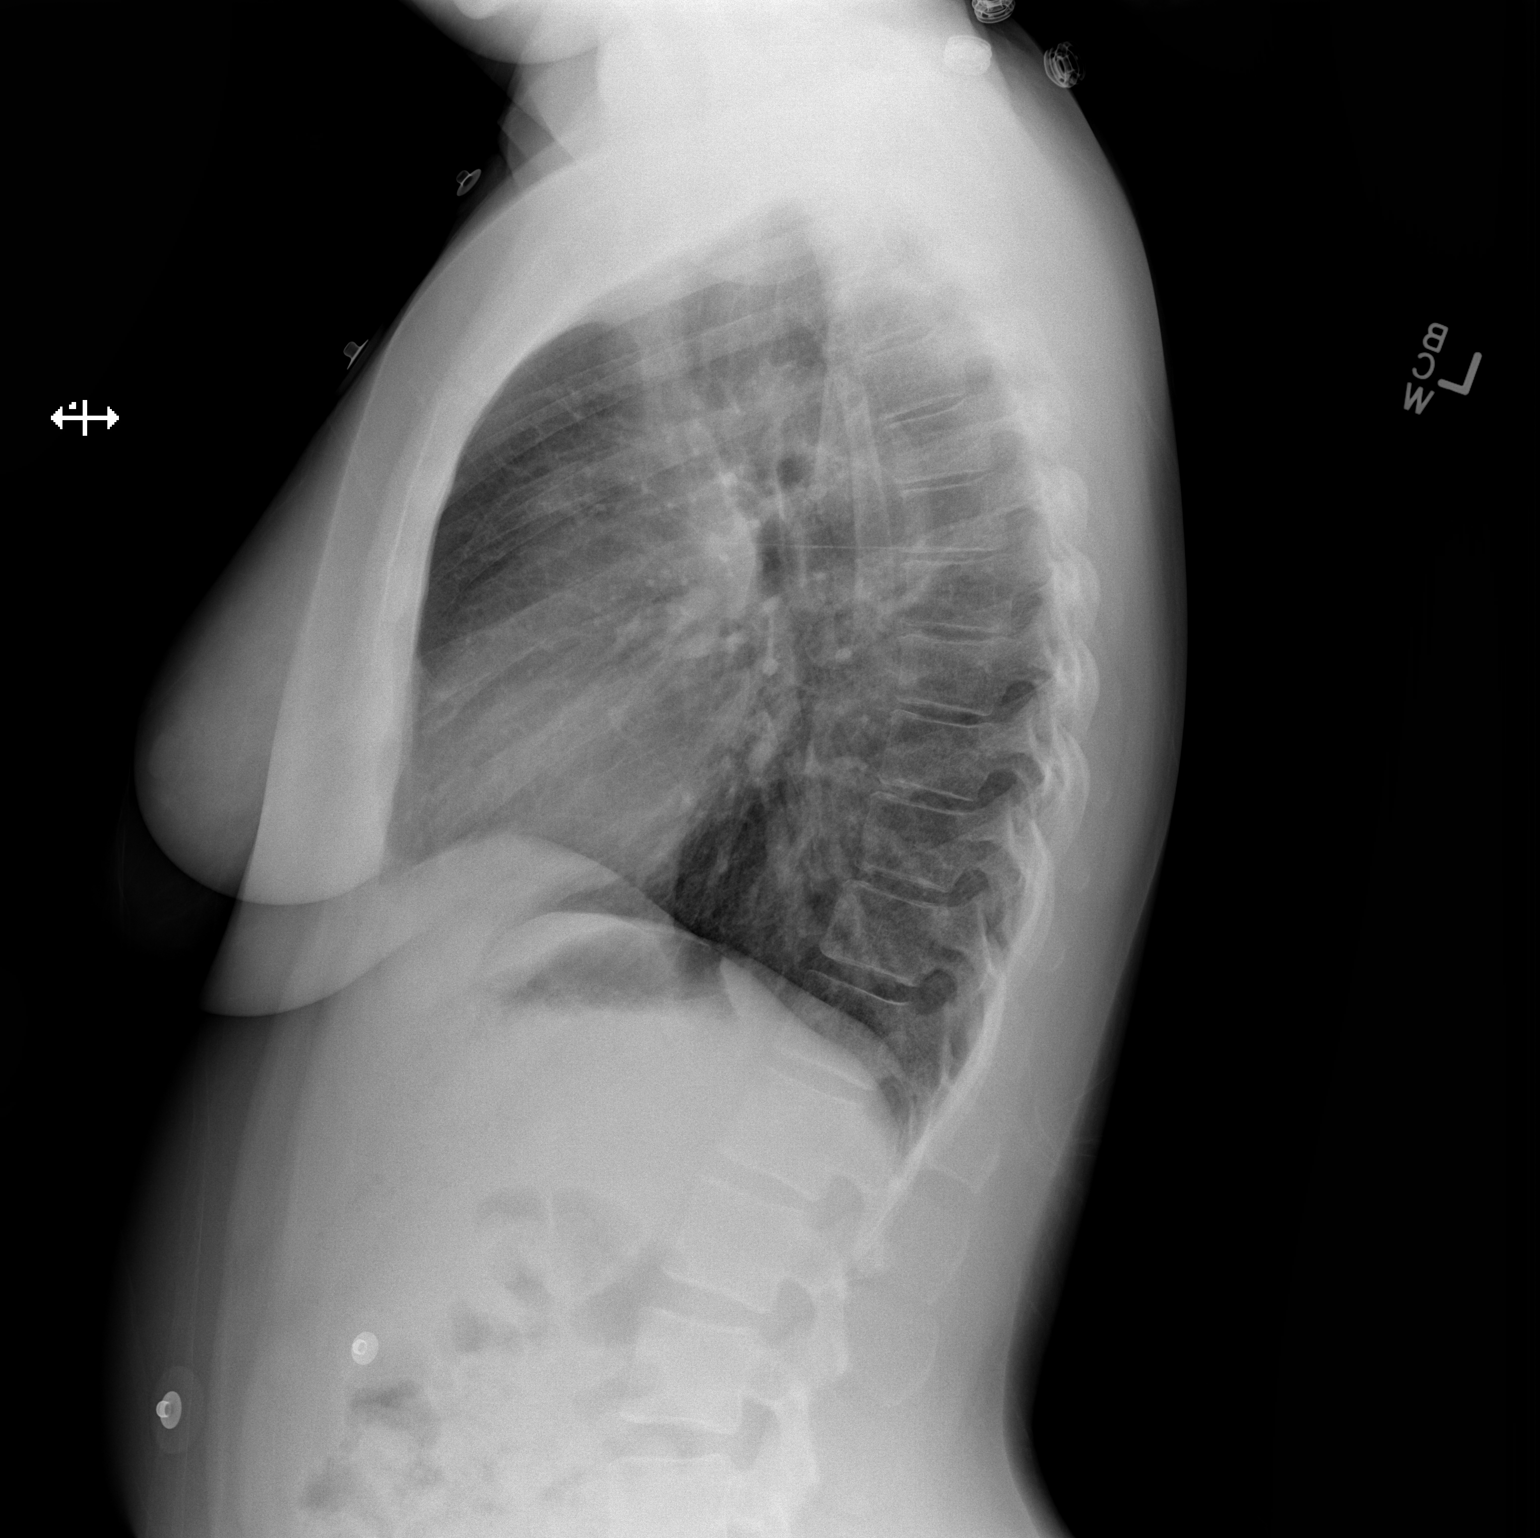

[2 of 2 positions shown; findings below may reference images not displayed]

FINDINGS: The heart size and mediastinal contours are within normal limits.
Both lungs are clear. The visualized skeletal structures are
unremarkable.
IMPRESSION: No active cardiopulmonary disease.

## 2017-06-05 ENCOUNTER — Ambulatory Visit (INDEPENDENT_AMBULATORY_CARE_PROVIDER_SITE_OTHER): Payer: BLUE CROSS/BLUE SHIELD | Admitting: Allergy & Immunology

## 2017-06-05 ENCOUNTER — Encounter: Payer: Self-pay | Admitting: Allergy & Immunology

## 2017-06-05 VITALS — BP 110/66 | HR 76 | Ht 63.0 in | Wt 164.6 lb

## 2017-06-05 DIAGNOSIS — J3089 Other allergic rhinitis: Secondary | ICD-10-CM

## 2017-06-05 DIAGNOSIS — J302 Other seasonal allergic rhinitis: Secondary | ICD-10-CM

## 2017-06-05 DIAGNOSIS — J4540 Moderate persistent asthma, uncomplicated: Secondary | ICD-10-CM | POA: Diagnosis not present

## 2017-06-05 MED ORDER — BUDESONIDE-FORMOTEROL FUMARATE 160-4.5 MCG/ACT IN AERO: 2.0000 | INHALATION_SPRAY | Freq: Two times a day (BID) | RESPIRATORY_TRACT | 5 refills | Status: DC

## 2017-06-05 MED ORDER — TRIAMCINOLONE ACETONIDE 0.1 % EX OINT: 1.0000 "application " | TOPICAL_OINTMENT | Freq: Two times a day (BID) | CUTANEOUS | 0 refills | Status: DC

## 2017-06-05 MED ORDER — AZELASTINE-FLUTICASONE 137-50 MCG/ACT NA SUSP: 2.0000 | NASAL | 5 refills | Status: DC

## 2017-06-05 MED ORDER — ALBUTEROL SULFATE HFA 108 (90 BASE) MCG/ACT IN AERS: 1.0000 | INHALATION_SPRAY | Freq: Four times a day (QID) | RESPIRATORY_TRACT | 3 refills | Status: AC | PRN

## 2017-06-05 MED ORDER — LEVOCETIRIZINE DIHYDROCHLORIDE 5 MG PO TABS: 5.0000 mg | ORAL_TABLET | Freq: Every evening | ORAL | 5 refills | Status: DC

## 2017-06-05 MED ORDER — MONTELUKAST SODIUM 10 MG PO TABS: 10.0000 mg | ORAL_TABLET | Freq: Every day | ORAL | 5 refills | Status: DC

## 2017-06-05 NOTE — Progress Notes (Signed)
FOLLOW UP  Date of Service/Encounter:  06/05/17   Assessment:   Moderate persistent asthma without complication  Seasonal and perennial allergic rhinitis (trees, weeds, grasses, molds and cat)   Asthma Reportables:  Severity: moderate persistent  Risk: high Control: not well controlled   Plan/Recommendations:   1. Moderate persistent asthma, uncomplicated - Lung function looked much worse today, likely because you are not using your controller medication. - Start the prednisone pack provided if you do not feel better in the next day or so. - Symbicort is your controller, and needs to be used EVERY DAY for the best effects.  - Teaching provided, including pictures of each inhaler, to avoid any problems with confusion.  - Daily controller medication(s): Symbicort 160/4.5 two puffs twice daily with spacer - Rescue medications: ProAir 4 puffs every 4-6 hours as needed  - Asthma control goals:  * Full participation in all desired activities (may need albuterol before activity) * Albuterol use two time or less a week on average (not counting use with activity) * Cough interfering with sleep two time or less a month * Oral steroids no more than once a year * No hospitalizations  2. Seasonal and perennial allergic rhinitis (trees, weeds, grasses, molds and cat) - Be sure to take your nose spray, as I think this will provide the best treatment for you.  - Continue with Xyzal (levocetirizine) 5mg  tablet once daily and Dymista (fluticasone/azelastine) two sprays per nostril 1-2 times daily as needed  - Add on Singulair (montelukast) 10mg  daily.  - You can use an extra dose of the antihistamine, if needed, for breakthrough symptoms.  - Consider nasal saline rinses 1-2 times daily to remove allergens from the nasal cavities as well as help with mucous clearance (this is especially helpful to do before the nasal sprays are given) - Consider allergy shots as a means of long-term  control. - Allergy shots "re-train" the immune system to ignore environmental allergens and decrease the resulting immune response to those allergens (sneezing, itchy watery eyes, runny nose, nasal congestion, etc).    3. Itching - Add on triamcinolone 0.1% ointment twice daily as needed for itching on the arms. - The Xyzal should help with the itching as well.   4. Return in about 3 months (around 09/03/2017)  Subjective:   Amber Neal is a 41 y.o. female presenting today for follow up of  Chief Complaint  Patient presents with  . Follow-up    Pt presents for follow up of seasonal allergy symptoms and asthma    Amber Neal has a history of the following: Patient Active Problem List   Diagnosis Date Noted  . Seasonal and perennial allergic rhinitis 02/20/2017  . Moderate persistent asthma without complication 02/20/2017    History obtained from: chart review and patient.  Amber Neal's Primary Care Provider [facility]     Amber Neal is a 41 y.o. female presenting for a follow up visit. She was last seen in August 2018 as a New Patient. At that time, she had testing that showed positives to trees, weeds, grasses, mold, and cat. We diagnosed her with moderate persistent asthma and started her on Symbicort 160/4.95mcg two puffs BID. We also did testing to the most common foods, and this was negative. We started her on Xyzal as well as Dymista. We also discussed allergen immunotherapy.   Since the last visit, Amber Neal has not done well. She is using her inhaler, but it turns out  that she is using the albuterol rather than the Symbicort. She used up the Symbicort sample that we gave her, and then just continued to use her albuterol. The albuterol does improve symptoms, but they return within a couple of hours. She has not needed prednisone, but does report some night time coughing. She seems completely confused when we ask about her asthma medications. However, she has not needed  prednisone, ED visits, or other clinic visits for her breathing.   Allergic rhinitis symptoms are not well controlled either, but she is using the Xyzal on a daily basis. She is not using the nasal spray at all. She is not interested in allergy shots at this time.    Otherwise, there have been no changes to her past medical history, surgical history, family history, or social history.    Review of Systems: a 14-point review of systems is pertinent for what is mentioned in HPI.  Otherwise, all other systems were negative. Constitutional: negative other than that listed in the HPI Eyes: negative other than that listed in the HPI Ears, nose, mouth, throat, and face: negative other than that listed in the HPI Respiratory: negative other than that listed in the HPI Cardiovascular: negative other than that listed in the HPI Gastrointestinal: negative other than that listed in the HPI Genitourinary: negative other than that listed in the HPI Integument: negative other than that listed in the HPI Hematologic: negative other than that listed in the HPI Musculoskeletal: negative other than that listed in the HPI Neurological: negative other than that listed in the HPI Allergy/Immunologic: negative other than that listed in the HPI    Objective:   Blood pressure 110/66, pulse 76, height 5\' 3"  (1.6 m), weight 164 lb 9.6 oz (74.7 kg), SpO2 99 %. Body mass index is 29.16 kg/m.   Physical Exam:  General: Alert, interactive, in no acute distress. Smiling. Somewhat short of breath today.  Eyes: No conjunctival injection bilaterally, no discharge on the right, no discharge on the left and no Horner-Trantas dots present. PERRL bilaterally. EOMI without pain. No photophobia.  Ears: Right TM pearly gray with normal light reflex, Left TM pearly gray with normal light reflex, Right TM intact without perforation and Left TM intact without perforation.  Nose/Throat: External nose within normal limits,  nasal crease present and septum midline. Turbinates markedly edematous and pale with clear discharge. Posterior oropharynx erythematous with cobblestoning in the posterior oropharynx. Tonsils 2+ without exudates.  Tongue without thrush. Adenopathy: no enlarged lymph nodes appreciated in the anterior cervical, occipital, axillary, epitrochlear, inguinal, or popliteal regions. Lungs: Decreased breath sounds bilaterally without wheezing, rhonchi or rales. Increased work of breathing. CV: Normal S1/S2. No murmurs. Capillary refill <2 seconds.  Skin: Warm and dry, without lesions or rashes. Neuro:   Grossly intact. No focal deficits appreciated. Responsive to questions.  Diagnostic studies:   Spirometry: results abnormal (FEV1: 1.90/64%, FVC: 2.21/62%, FEV1/FVC: 86%).    Spirometry consistent with possible restrictive disease. Xopenex/Atrovent nebulizer treatment given in clinic with significant improvement in FVC per ATS criteria. The FVC increased by 320mL (14%) and the FEV1 increased by 210mL (11%). She did feel better following the nebulizer treatment.   Allergy Studies: none      Malachi BondsJoel Ladamien Rammel, MD [facility]

## 2017-06-05 NOTE — Patient Instructions (Addendum)
1. Moderate persistent asthma, uncomplicated - Lung function looked much worse today, likely because you are not using your controller medication. - Start the prednisone pack provided if you do not feel better in the next day or so. - Symbicort is your controller, and needs to be used EVERY DAY for the best effects.  - Daily controller medication(s): Symbicort 160/4.5 two puffs twice daily with spacer   - Rescue medications: ProAir 4 puffs every 4-6 hours as needed    - Asthma control goals:  * Full participation in all desired activities (may need albuterol before activity) * Albuterol use two time or less a week on average (not counting use with activity) * Cough interfering with sleep two time or less a month * Oral steroids no more than once a year * No hospitalizations  2. Seasonal and perennial allergic rhinitis (trees, weeds, grasses, molds and cat) - Be sure to take your nose spray, as I think this will provide the best treatment for you.  - Continue with Xyzal (levocetirizine) 5mg  tablet once daily and Dymista (fluticasone/azelastine) two sprays per nostril 1-2 times daily as needed  - Add on Singulair (montelukast) 10mg  daily.  - You can use an extra dose of the antihistamine, if needed, for breakthrough symptoms.  - Consider nasal saline rinses 1-2 times daily to remove allergens from the nasal cavities as well as help with mucous clearance (this is especially helpful to do before the nasal sprays are given) - Consider allergy shots as a means of long-term control. - Allergy shots "re-train" the immune system to ignore environmental allergens and decrease the resulting immune response to those allergens (sneezing, itchy watery eyes, runny nose, nasal congestion, etc).    3. Itching - Add on triamcinolone 0.1% ointment twice daily as needed for itching on the arms. - The Xyzal should help with the itching as well.   4. Return in about 3 months (around 09/03/2017).   Please  inform us of any Emergency Department visits, hospitalizations, or changes in symptoms. Call us before going to the ED for breathing or allergy symptoms since we might be able to fit you in for a sick visit. Feel free to contact us anytime with any questions, problems, or concerns.  It was a pleasure to see you and your family again today! Enjoy the holiday season!  Websites that have reliable patient information: 1. American Academy of Asthma, Allergy, and Immunology: www.aaaai.org 2. Food Allergy Research and Education (FARE): foodallergy.org 3. Mothers of Asthmatics: http://www.asthmacommunitynetwork.org 4. American College of Allergy, Asthma, and Immunology: www.acaai.org

## 2017-07-04 DIAGNOSIS — Z1151 Encounter for screening for human papillomavirus (HPV): Secondary | ICD-10-CM | POA: Diagnosis not present

## 2017-07-04 DIAGNOSIS — R350 Frequency of micturition: Secondary | ICD-10-CM | POA: Diagnosis not present

## 2017-07-04 DIAGNOSIS — Z6829 Body mass index (BMI) 29.0-29.9, adult: Secondary | ICD-10-CM | POA: Diagnosis not present

## 2017-07-04 DIAGNOSIS — Z01419 Encounter for gynecological examination (general) (routine) without abnormal findings: Secondary | ICD-10-CM | POA: Diagnosis not present

## 2017-07-04 DIAGNOSIS — R87612 Low grade squamous intraepithelial lesion on cytologic smear of cervix (LGSIL): Secondary | ICD-10-CM | POA: Diagnosis not present

## 2017-07-28 DIAGNOSIS — N971 Female infertility of tubal origin: Secondary | ICD-10-CM | POA: Diagnosis not present

## 2017-07-28 DIAGNOSIS — E288 Other ovarian dysfunction: Secondary | ICD-10-CM | POA: Diagnosis not present

## 2017-07-28 DIAGNOSIS — N840 Polyp of corpus uteri: Secondary | ICD-10-CM | POA: Diagnosis not present

## 2017-07-28 DIAGNOSIS — Z319 Encounter for procreative management, unspecified: Secondary | ICD-10-CM | POA: Diagnosis not present

## 2017-09-19 DIAGNOSIS — B373 Candidiasis of vulva and vagina: Secondary | ICD-10-CM | POA: Diagnosis not present

## 2017-09-19 DIAGNOSIS — R350 Frequency of micturition: Secondary | ICD-10-CM | POA: Diagnosis not present

## 2017-09-19 DIAGNOSIS — N644 Mastodynia: Secondary | ICD-10-CM | POA: Diagnosis not present

## 2018-03-31 DIAGNOSIS — N939 Abnormal uterine and vaginal bleeding, unspecified: Secondary | ICD-10-CM | POA: Diagnosis not present

## 2018-04-08 DIAGNOSIS — N939 Abnormal uterine and vaginal bleeding, unspecified: Secondary | ICD-10-CM | POA: Diagnosis not present

## 2018-04-20 ENCOUNTER — Encounter: Payer: Self-pay | Admitting: Gastroenterology

## 2018-05-18 DIAGNOSIS — Z319 Encounter for procreative management, unspecified: Secondary | ICD-10-CM | POA: Diagnosis not present

## 2018-05-18 DIAGNOSIS — E288 Other ovarian dysfunction: Secondary | ICD-10-CM | POA: Diagnosis not present

## 2018-05-19 ENCOUNTER — Ambulatory Visit: Payer: BLUE CROSS/BLUE SHIELD | Admitting: Gastroenterology

## 2018-06-16 ENCOUNTER — Ambulatory Visit: Payer: BLUE CROSS/BLUE SHIELD | Admitting: Gastroenterology

## 2018-06-16 ENCOUNTER — Other Ambulatory Visit (INDEPENDENT_AMBULATORY_CARE_PROVIDER_SITE_OTHER): Payer: BLUE CROSS/BLUE SHIELD

## 2018-06-16 ENCOUNTER — Encounter: Payer: Self-pay | Admitting: Gastroenterology

## 2018-06-16 VITALS — BP 94/60 | HR 76 | Ht 62.5 in | Wt 165.0 lb

## 2018-06-16 DIAGNOSIS — R1084 Generalized abdominal pain: Secondary | ICD-10-CM | POA: Diagnosis not present

## 2018-06-16 DIAGNOSIS — R110 Nausea: Secondary | ICD-10-CM

## 2018-06-16 DIAGNOSIS — R140 Abdominal distension (gaseous): Secondary | ICD-10-CM

## 2018-06-16 LAB — COMPREHENSIVE METABOLIC PANEL
ALBUMIN: 4.4 g/dL (ref 3.5–5.2)
ALK PHOS: 32 U/L — AB (ref 39–117)
ALT: 14 U/L (ref 0–35)
AST: 19 U/L (ref 0–37)
BUN: 8 mg/dL (ref 6–23)
CHLORIDE: 105 meq/L (ref 96–112)
CO2: 24 mEq/L (ref 19–32)
Calcium: 9.4 mg/dL (ref 8.4–10.5)
Creatinine, Ser: 0.82 mg/dL (ref 0.40–1.20)
GFR: 98.96 mL/min (ref >60.00)
GLUCOSE: 87 mg/dL (ref 70–99)
POTASSIUM: 3.6 meq/L (ref 3.5–5.1)
SODIUM: 138 meq/L (ref 135–145)
TOTAL PROTEIN: 7.9 g/dL (ref 6.0–8.3)
Total Bilirubin: 0.7 mg/dL (ref 0.2–1.2)

## 2018-06-16 LAB — CBC WITH DIFFERENTIAL/PLATELET
BASOS PCT: 0.9 % (ref 0.0–3.0)
Basophils Absolute: 0 10*3/uL (ref 0.0–0.1)
EOS PCT: 1.7 % (ref 0.0–5.0)
Eosinophils Absolute: 0.1 10*3/uL (ref 0.0–0.7)
HCT: 39 % (ref 36.0–46.0)
HEMOGLOBIN: 13.7 g/dL (ref 12.0–15.0)
Lymphocytes Relative: 39.6 % (ref 12.0–46.0)
Lymphs Abs: 1.9 10*3/uL (ref 0.7–4.0)
MCHC: 35.1 g/dL (ref 30.0–36.0)
MCV: 95.4 fl (ref 78.0–100.0)
MONOS PCT: 9.6 % (ref 3.0–12.0)
Monocytes Absolute: 0.5 10*3/uL (ref 0.1–1.0)
Neutro Abs: 2.3 10*3/uL (ref 1.4–7.7)
Neutrophils Relative %: 48.2 % (ref 43.0–77.0)
Platelets: 178 10*3/uL (ref 150.0–400.0)
RBC: 4.09 Mil/uL (ref 3.87–5.11)
RDW: 12.6 % (ref 11.5–15.5)
WBC: 4.8 10*3/uL (ref 4.0–10.5)

## 2018-06-16 MED ORDER — PEG-KCL-NACL-NASULF-NA ASC-C 140 G PO SOLR: 140.0000 g | ORAL | 0 refills | Status: DC

## 2018-06-16 NOTE — Patient Instructions (Signed)
If you are age 40 or older, your body mass index should be between 23-30. Your Body mass index is 29.7 kg/m. If this is out of the aforementioned range listed, please consider follow up with your Primary Care Provider.  If you are age 40 or younger, your body mass index should be between 19-25. Your Body mass index is 29.7 kg/m. If this is out of the aformentioned range listed, please consider follow up with your Primary Care Provider.   You have been scheduled for an endoscopy and colonoscopy. Please follow the written instructions given to you at your visit today. Please pick up your prep supplies at the pharmacy within the next 1-3 days. If you use inhalers (even only as needed), please bring them with you on the day of your procedure. Your physician has requested that you go to www.startemmi.com and enter the access code given to you at your visit today. This web site gives a general overview about your procedure. However, you should still follow specific instructions given to you by our office regarding your preparation for the procedure.  Your provider has requested that you go to the basement level for lab work before leaving today. Press "B" on the elevator. The lab is located at the first door on the left as you exit the elevator.   It was a pleasure to see you today!  Dr. Myrtie Neitheranis

## 2018-06-16 NOTE — Progress Notes (Addendum)
[facility] Consult Note:  History: Amber Neal 06/16/2018  Referring physician: self-referred  Reason for consult/chief complaint: Bloated; Abdominal Pain (epigastric/upper abd pain and having movement in stomach like something is there); and Constipation (?)   Subjective  HPI:  This is a very pleasant 42 year old woman self-referred for at least 2 years of abdominal pain and distention.  She reports having seen primary care about it some point in the past, and was prescribed Metamucil without improvement.  She does not recall any testing performed. He describes at least 2 years of generalized abdominal pain but more so in the upper abdomen with frequent bloating and visible distention.  She had a photograph of one episode which she showed me today.  When there is severe distention, she has more sharp pain.  She has intermittent nausea but no vomiting.  There is early satiety and her appetite is fair some days.  She has occasional dysphagia without odynophagia. Her usual bowel pattern is every 2 or 3 days, which has been the case for many years.  It has not changed particularly in the last couple years of this abdominal pain and distention, and she denies rectal bleeding.  On a few occasions she has blood with wiping.  Levada tells me that she went to gynecology perhaps a month ago and described the symptoms to them as well.  She says a complete exam was done and what sounds are transvaginal ultrasound that she recalls was normal other than perhaps some "polyps". Her only surgery is a tubal ligation in August 2012, and I reviewed the operative report, she describes pelvic adhesions.  The procedure was performed along with adhesio lysis .  The patient reports having had a prior STD, which may account for the adhesions.  ROS:  Review of Systems  Constitutional: Positive for fatigue. Negative for appetite change and unexpected weight change.  HENT: Negative for mouth sores and  voice change.   Eyes: Negative for pain and redness.  Respiratory: Negative for cough and shortness of breath.   Cardiovascular: Negative for chest pain and palpitations.  Genitourinary: Positive for frequency. Negative for dysuria and hematuria.  Musculoskeletal: Negative for arthralgias and myalgias.  Skin: Negative for pallor and rash.  Neurological: Negative for weakness and headaches.  Hematological: Negative for adenopathy.     Past Medical History: Past Medical History:  Diagnosis Date  . Asthma   . Headache(784.0)    migraines  . Seasonal allergies      Past Surgical History: Past Surgical History:  Procedure Laterality Date  . LAPAROSCOPY  02/01/2011   Procedure: LAPAROSCOPY OPERATIVE;  Surgeon: Fermin Schwab;  Location: [facility];  Service: Gynecology;  Laterality: N/A;  Lysis of Adhesions; Left Partial Salpingectomy; Removal of Left Hydrosalpinx; Chromopertubation; Flouroscopy with Cannullization of Right Fallopian Tube  . operative lap  2011   Tubal ligation  Family History: Family History  Problem Relation Age of Onset  . Asthma Mother   . Heart disease Mother   . Asthma Maternal Grandmother   . Heart disease Father   . Allergic rhinitis Neg Hx   . Angioedema Neg Hx   . Eczema Neg Hx   . Immunodeficiency Neg Hx   . Urticaria Neg Hx     Social History: Social History   Socioeconomic History  . Marital status: Single    Spouse name: Not on file  . Number of children: 0  . Years of education: Not on file  . Highest education level: Not  on file  Occupational History  . Occupation: CNA  Social Needs  . Financial resource strain: Not on file  . Food insecurity:    Worry: Not on file    Inability: Not on file  . Transportation needs:    Medical: Not on file    Non-medical: Not on file  Tobacco Use  . Smoking status: Never Smoker  . Smokeless tobacco: Never Used  Substance and Sexual Activity  . Alcohol use: Yes    Comment: occasional  . Drug  use: No  . Sexual activity: Yes    Birth control/protection: Condom  Lifestyle  . Physical activity:    Days per week: Not on file    Minutes per session: Not on file  . Stress: Not on file  Relationships  . Social connections:    Talks on phone: Not on file    Gets together: Not on file    Attends religious service: Not on file    Active member of club or organization: Not on file    Attends meetings of clubs or organizations: Not on file    Relationship status: Not on file  Other Topics Concern  . Not on file  Social History Narrative   ** Merged History Encounter **       Born in [place] CNA at the [employer]  Allergies: Allergies  Allergen Reactions  . Latex Itching    Outpatient Meds: Current Outpatient Medications  Medication Sig Dispense Refill  . albuterol (PROVENTIL HFA;VENTOLIN HFA) 108 (90 Base) MCG/ACT inhaler Inhale 1-2 puffs into the lungs every 6 (six) hours as needed for wheezing or shortness of breath. 1 Inhaler 3  . Azelastine-Fluticasone (DYMISTA) 137-50 MCG/ACT SUSP Place 2 sprays into both nostrils 1 day or 1 dose. 1 Bottle 5  . budesonide-formoterol (SYMBICORT) 160-4.5 MCG/ACT inhaler Inhale 2 puffs into the lungs 2 (two) times daily. 1 Inhaler 5  . PEG-KCl-NaCl-NaSulf-Na Asc-C (PLENVU) 140 g SOLR Take 140 g by mouth as directed. 1 each 0   No current facility-administered medications for this visit.       ___________________________________________________________________ Objective   Exam:  BP 94/60 (BP Location: Left Arm, Patient Position: Sitting, Cuff Size: Normal)   Pulse 76   Ht 5' 2.5" (1.588 m) Comment: height measured without shoes  Wt 165 lb (74.8 kg)   LMP 05/31/2018   BMI 29.70 kg/m    General: this is a(n) well-appearing woman  Eyes: sclera anicteric, no redness  ENT: oral mucosa moist without lesions, no cervical or supraclavicular lymphadenopathy, good dentition  CV: RRR without murmur, S1/S2, no JVD, no  peripheral edema  Resp: clear to auscultation bilaterally, normal RR and effort noted  GI: soft, LLQ tenderness, with active bowel sounds. No guarding or palpable organomegaly noted.  Skin; warm and dry, no rash or jaundice noted  Neuro: awake, alert and oriented x 3. Normal gross motor function and fluent speech  Labs:  No data for review.  Primary care has done no testing, according to patient  Micah Flesher to gynecology recently, and had ultrasound (no report available)  Assessment: Encounter Diagnoses  Name Primary?  . Generalized abdominal pain Yes  . Abdominal distension   . Nausea without vomiting     Symptoms somewhat difficult to characterize.  It is generalized pain, but more so in the upper abdomen, where she also feels as if something is "moving".  There is significant abdominal distention at times, which may be functional, bacterial overgrowth (though no clear risk  factors for that), or mechanical with a history of pelvic adhesions.  Plan:  Request records from recent gynecology appointment EGD and colonoscopy.  She is agreeable after discussion of procedure and risks.  Thank you for the courtesy of this consult.  Please call me with any questions or concerns.  Charlie PitterHenry L Danis III   Addendum:  [facility]. Office notes from 03/31/18 and 04/08/18 TVUS repot from 04/08/18 with R ovary 2.4 cm complex cyst and thickened enometrium  H. Myrtie Neitheranis, MD 07/07/18

## 2018-06-30 ENCOUNTER — Encounter: Payer: Self-pay | Admitting: Gastroenterology

## 2018-07-06 ENCOUNTER — Encounter: Payer: Self-pay | Admitting: Gastroenterology

## 2018-07-06 ENCOUNTER — Ambulatory Visit (AMBULATORY_SURGERY_CENTER): Payer: BLUE CROSS/BLUE SHIELD | Admitting: Gastroenterology

## 2018-07-06 VITALS — BP 96/66 | HR 81 | Temp 96.8°F | Resp 17 | Ht 62.5 in | Wt 165.0 lb

## 2018-07-06 DIAGNOSIS — R140 Abdominal distension (gaseous): Secondary | ICD-10-CM | POA: Diagnosis not present

## 2018-07-06 DIAGNOSIS — Z1211 Encounter for screening for malignant neoplasm of colon: Secondary | ICD-10-CM | POA: Diagnosis not present

## 2018-07-06 DIAGNOSIS — R1084 Generalized abdominal pain: Secondary | ICD-10-CM

## 2018-07-06 MED ORDER — SODIUM CHLORIDE 0.9 % IV SOLN: 500.0000 mL | Freq: Once | INTRAVENOUS | Status: DC

## 2018-07-06 NOTE — Op Note (Signed)
[facility] Patient Name: Amber SchirmerKou Jarnigan Procedure Date: 07/06/2018 2:01 PM MRN: [id number] Endoscopist: Sherilyn CooterHenry L. Myrtie Neitheranis , MD Age: [age] Referring MD:  Date of Birth: 1976-05-04 Gender: Female Account #: [id number] Procedure:                Upper GI endoscopy Indications:              Generalized abdominal pain, Abdominal bloating,                            Nausea Medicines:                Monitored Anesthesia Care Procedure:                Pre-Anesthesia Assessment:                           - Prior to the procedure, a History and Physical                            was performed, and patient medications and                            allergies were reviewed. The patient's tolerance of                            previous anesthesia was also reviewed. The risks                            and benefits of the procedure and the sedation                            options and risks were discussed with the patient.                            All questions were answered, and informed consent                            was obtained. Prior Anticoagulants: The patient has                            taken no previous anticoagulant or antiplatelet                            agents. ASA Grade Assessment: II - A patient with                            mild systemic disease. After reviewing the risks                            and benefits, the patient was deemed in                            satisfactory condition to undergo the procedure.  After obtaining informed consent, the endoscope was                            passed under direct vision. Throughout the                            procedure, the patient's blood pressure, pulse, and                            oxygen saturations were monitored continuously. The                            Model GIF-[id number]) scope was introduced                            through the mouth, and advanced to the second part                        of duodenum. The upper GI endoscopy was                            accomplished without difficulty. The patient                            tolerated the procedure well. Scope In: Scope Out: Findings:                 The larynx was normal.                           The esophagus was normal.                           The entire examined stomach was normal. Biopsies                            were taken with a cold forceps for Helicobacter                            pylori testing using CLOtest.                           The cardia and gastric fundus were normal on                            retroflexion.                           The examined duodenum was normal. Complications:            No immediate complications. Estimated Blood Loss:     Estimated blood loss was minimal. Impression:               - Normal larynx.                           - Normal esophagus.                           -  Normal stomach. Biopsied.                           - Normal examined duodenum. Recommendation:           - Patient has a contact number available for                            emergencies. The signs and symptoms of potential                            delayed complications were discussed with the                            patient. Return to normal activities tomorrow.                            Written discharge instructions were provided to the                            patient.                           - Resume previous diet.                           - Continue present medications.                           - Await pathology results, then further                            recommendation based on results.                           - See the other procedure note for documentation of                            additional recommendations. Dagmar Adcox L. Myrtie Neither, MD 07/06/2018 2:36:58 PM This report has been signed electronically.

## 2018-07-06 NOTE — Progress Notes (Signed)
To PACU, VSS. Report to [email or web address] 

## 2018-07-06 NOTE — Op Note (Signed)
[facility] Patient Name: Amber Neal Procedure Date: 07/06/2018 2:01 PM MRN: [id number] Endoscopist: Sherilyn Cooter L. Myrtie Neither , MD Age: 43 Referring MD:  Date of Birth: 1975/07/06 Gender: Female Account #: [id number] Procedure:                Colonoscopy Indications:              Generalized abdominal pain, abdominal bloating and                            distension Medicines:                Monitored Anesthesia Care Procedure:                Pre-Anesthesia Assessment:                           - Prior to the procedure, a History and Physical                            was performed, and patient medications and                            allergies were reviewed. The patient's tolerance of                            previous anesthesia was also reviewed. The risks                            and benefits of the procedure and the sedation                            options and risks were discussed with the patient.                            All questions were answered, and informed consent                            was obtained. Prior Anticoagulants: The patient has                            taken no previous anticoagulant or antiplatelet                            agents. ASA Grade Assessment: II - A patient with                            mild systemic disease. After reviewing the risks                            and benefits, the patient was deemed in                            satisfactory condition to undergo the procedure.  After obtaining informed consent, the colonoscope                            was passed under direct vision. Throughout the                            procedure, the patient's blood pressure, pulse, and                            oxygen saturations were monitored continuously. The                            Model PCF-H190DL [id number]) scope was introduced                            through the anus and advanced to the the terminal                          ileum, with identification of the appendiceal                            orifice and IC valve. The colonoscopy was performed                            without difficulty. The patient tolerated the                            procedure well. The quality of the bowel                            preparation was excellent. The ileocecal valve,                            appendiceal orifice, and rectum were photographed. Scope In: 2:08:29 PM Scope Out: 2:17:19 PM Scope Withdrawal Time: 0 hours 6 minutes 22 seconds  Total Procedure Duration: 0 hours 8 minutes 50 seconds  Findings:                 The perianal and digital rectal examinations were                            normal.                           The terminal ileum appeared normal.                           The entire examined colon appeared normal on direct                            and retroflexion views. Complications:            No immediate complications. Estimated Blood Loss:     Estimated blood loss: none. Impression:               - The examined portion of the ileum was normal.                           -  The entire examined colon is normal on direct and                            retroflexion views.                           - No specimens collected. Recommendation:           - Patient has a contact number available for                            emergencies. The signs and symptoms of potential                            delayed complications were discussed with the                            patient. Return to normal activities tomorrow.                            Written discharge instructions were provided to the                            patient.                           - Resume previous diet.                           - Continue present medications.                           - Repeat colonoscopy in 10 years for screening                            purposes.                           - See the  other procedure note for documentation of                            additional recommendations. Navin Dogan L. Myrtie Neither, MD 07/06/2018 2:34:14 PM This report has been signed electronically.

## 2018-07-06 NOTE — Progress Notes (Signed)
Called to room to assist during endoscopic procedure.  Patient ID and intended procedure confirmed with present staff. Received instructions for my participation in the procedure from the performing physician.  

## 2018-07-06 NOTE — Progress Notes (Signed)
Pt's states no medical or surgical changes since previsit or office visit. 

## 2018-07-06 NOTE — Patient Instructions (Signed)
Impression/Recommendations:  Resume previous diet. Continue present medications.  Repeat colonoscopy in 10 years for screening purposes.  YOU HAD AN ENDOSCOPIC PROCEDURE TODAY AT THE [facility]:   Refer to the procedure report that was given to you for any specific questions about what was found during the examination.  If the procedure report does not answer your questions, please call your gastroenterologist to clarify.  If you requested that your care partner not be given the details of your procedure findings, then the procedure report has been included in a sealed envelope for you to review at your convenience later.  YOU SHOULD EXPECT: Some feelings of bloating in the abdomen. Passage of more gas than usual.  Walking can help get rid of the air that was put into your GI tract during the procedure and reduce the bloating. If you had a lower endoscopy (such as a colonoscopy or flexible sigmoidoscopy) you may notice spotting of blood in your stool or on the toilet paper. If you underwent a bowel prep for your procedure, you may not have a normal bowel movement for a few days.  Please Note:  You might notice some irritation and congestion in your nose or some drainage.  This is from the oxygen used during your procedure.  There is no need for concern and it should clear up in a day or so.  SYMPTOMS TO REPORT IMMEDIATELY:   Following lower endoscopy (colonoscopy or flexible sigmoidoscopy):  Excessive amounts of blood in the stool  Significant tenderness or worsening of abdominal pains  Swelling of the abdomen that is new, acute  Fever of 100F or higher   Following upper endoscopy (EGD)  Vomiting of blood or coffee ground material  New chest pain or pain under the shoulder blades  Painful or persistently difficult swallowing  New shortness of breath  Fever of 100F or higher  Black, tarry-looking stools  For urgent or emergent issues, a gastroenterologist can be reached  at any hour by calling [phone].   DIET:  We do recommend a small meal at first, but then you may proceed to your regular diet.  Drink plenty of fluids but you should avoid alcoholic beverages for 24 hours.  ACTIVITY:  You should plan to take it easy for the rest of today and you should NOT DRIVE or use heavy machinery until tomorrow (because of the sedation medicines used during the test).    FOLLOW UP: Our staff will call the number listed on your records the next business day following your procedure to check on you and address any questions or concerns that you may have regarding the information given to you following your procedure. If we do not reach you, we will leave a message.  However, if you are feeling well and you are not experiencing any problems, there is no need to return our call.  We will assume that you have returned to your regular daily activities without incident.  If any biopsies were taken you will be contacted by phone or by letter within the next 1-3 weeks.  Please call us at [phone] if you have not heard about the biopsies in 3 weeks.    SIGNATURES/CONFIDENTIALITY: You and/or your care partner have signed paperwork which will be entered into your electronic medical record.  These signatures attest to the fact that that the information above on your After Visit Summary has been reviewed and is understood.  Full responsibility of the confidentiality of this discharge information   lies with you and/or your care-partner. 

## 2018-07-07 ENCOUNTER — Telehealth: Payer: Self-pay

## 2018-07-07 LAB — HELICOBACTER PYLORI SCREEN-BIOPSY: UREASE: NEGATIVE

## 2018-07-07 NOTE — Telephone Encounter (Signed)
Name identifier, left a voicemail, will try back later today.

## 2018-07-07 NOTE — Telephone Encounter (Signed)
  Follow up Call-  Call back number 07/06/2018  Post procedure Call Back phone  # [phone]  Permission to leave phone message Yes  Some recent data might be hidden     Patient questions:  Do you have a fever, pain , or abdominal swelling? No. Pain Score  0 *  Have you tolerated food without any problems? Yes.    Have you been able to return to your normal activities? Yes.    Do you have any questions about your discharge instructions: Diet   No. Medications  No. Follow up visit  No.  Do you have questions or concerns about your Care? No.  Actions: * If pain score is 4 or above: No action needed, pain <4.

## 2018-08-03 DIAGNOSIS — B349 Viral infection, unspecified: Secondary | ICD-10-CM | POA: Diagnosis not present

## 2018-08-03 DIAGNOSIS — J0100 Acute maxillary sinusitis, unspecified: Secondary | ICD-10-CM | POA: Diagnosis not present

## 2018-10-01 ENCOUNTER — Telehealth: Payer: Self-pay | Admitting: Gastroenterology

## 2018-10-01 NOTE — Telephone Encounter (Signed)
PT called said she received a letter about some samples that Dr. Myrtie Neither would like for her to try.

## 2018-10-02 NOTE — Telephone Encounter (Signed)
Note below from Dr. Myrtie Neither:  Please let her know the stomach biopsy was negative for H. pylori bacteria. Given that in the normal upper endoscopy and colonoscopy, I think her symptoms are most likely irritable bowel. I would like her to try some medication samples to see if it helps symptoms, and then follow-up with me afterwards.  Prucalopride 2 mg tablet, 1 tablet daily.  Left message for pt to call back.

## 2018-10-05 NOTE — Telephone Encounter (Signed)
Left message for patient to call back  

## 2018-10-07 MED ORDER — PRUCALOPRIDE SUCCINATE 2 MG PO TABS: 1.0000 | ORAL_TABLET | Freq: Every day | ORAL | 0 refills | Status: DC

## 2018-10-07 NOTE — Telephone Encounter (Signed)
Left message for patient to call back Samples of motegrity placed out front

## 2018-10-07 NOTE — Telephone Encounter (Signed)
Pt returned call, she will come to p/u samples.

## 2019-08-10 ENCOUNTER — Ambulatory Visit: Payer: BLUE CROSS/BLUE SHIELD

## 2019-08-17 ENCOUNTER — Ambulatory Visit: Payer: Self-pay | Admitting: Family Medicine

## 2019-08-17 VITALS — BP 110/78 | HR 88 | Ht 63.0 in | Wt 166.0 lb

## 2019-08-17 DIAGNOSIS — Z789 Other specified health status: Secondary | ICD-10-CM

## 2019-08-17 NOTE — Progress Notes (Signed)
  Subjective:     Patient ID: Amber Neal, female   DOB: 1976-02-26, 44 y.o.   MRN: [id number]  HPI  [facility] for her required wellness visit for her insurance. She goes to [facility] for her PCP needs. She was diagnosed with covid-19 in December and had symptoms for 3 weeks. She states last week was having issues with some joint pain, but is fine now. She denies any shortness of breath and states she is feeling well now. She reports her pap smear is UTD and she is scheduled for her first mammogram next week.   Past Medical History:  Diagnosis Date  . Asthma   . Headache(784.0)    migraines  . Seasonal allergies    Allergies  Allergen Reactions  . Latex Itching    Current Outpatient Medications:  .  albuterol (PROVENTIL HFA;VENTOLIN HFA) 108 (90 Base) MCG/ACT inhaler, Inhale 1-2 puffs into the lungs every 6 (six) hours as needed for wheezing or shortness of breath., Disp: 1 Inhaler, Rfl: 3    Review of Systems  Constitutional: Negative for chills, fatigue, fever and unexpected weight change.  HENT: Negative for congestion, ear pain, sinus pressure, sinus pain and sore throat.   Eyes: Negative for discharge and visual disturbance.  Respiratory: Negative for cough, shortness of breath and wheezing.   Cardiovascular: Negative for chest pain and leg swelling.  Gastrointestinal: Negative for abdominal pain, blood in stool, constipation, diarrhea, nausea and vomiting.  Genitourinary: Negative for difficulty urinating and hematuria.  Skin: Negative for color change.  Neurological: Negative for dizziness, weakness, light-headedness and headaches.  Hematological: Negative for adenopathy.  All other systems reviewed and are negative.      Objective:   Physical Exam Vitals and nursing note reviewed.  Constitutional:      General: She is not in acute distress.    Appearance: Normal appearance. She is well-developed. She is not toxic-appearing.   HENT:     Head: Normocephalic and atraumatic.  Cardiovascular:     Rate and Rhythm: Normal rate and regular rhythm.     Heart sounds: Normal heart sounds. No murmur.  Pulmonary:     Effort: Pulmonary effort is normal. No respiratory distress.     Breath sounds: Normal breath sounds.  Musculoskeletal:     Cervical back: Neck supple.  Skin:    General: Skin is warm and dry.  Neurological:     Mental Status: She is alert and oriented to person, place, and time.  Psychiatric:        Mood and Affect: Mood normal.        Behavior: Behavior normal.    Today's Vitals   08/17/19 0858  BP: 110/78  Pulse: 88  SpO2: 99%  Weight: 166 lb (75.3 kg)  Height: 5\' 3"  (1.6 m)   Body mass index is 29.41 kg/m.      Assessment:     Participant in health and wellness plan      Plan:     1. Keep all regular appts with PCP.  2. Encouraged healthy diet and exercise for weight loss. Discussed choosing healthier options for take out foods, like grilled chicken instead of fried, and a side salad instead of french fries.  3. F/u here prn.
